# Patient Record
Sex: Male | Born: 1960 | Race: White | Hispanic: No | Marital: Married | Smoking: Never smoker
Health system: Southern US, Community
[De-identification: ages and names within clinical notes are randomized; demographics above are authoritative.]

## PROBLEM LIST (undated history)

## (undated) DIAGNOSIS — Z87442 Personal history of urinary calculi: Secondary | ICD-10-CM

## (undated) DIAGNOSIS — Z789 Other specified health status: Secondary | ICD-10-CM

## (undated) DIAGNOSIS — M109 Gout, unspecified: Secondary | ICD-10-CM

## (undated) DIAGNOSIS — L570 Actinic keratosis: Secondary | ICD-10-CM

## (undated) DIAGNOSIS — K219 Gastro-esophageal reflux disease without esophagitis: Secondary | ICD-10-CM

## (undated) HISTORY — DX: Actinic keratosis: L57.0

## (undated) HISTORY — PX: TONSILLECTOMY: SUR1361

## (undated) HISTORY — PX: COLONOSCOPY: SHX174

---

## 2004-12-07 ENCOUNTER — Ambulatory Visit: Payer: Self-pay | Admitting: Internal Medicine

## 2007-07-19 ENCOUNTER — Ambulatory Visit: Payer: Self-pay | Admitting: Family Medicine

## 2012-05-06 ENCOUNTER — Ambulatory Visit: Payer: Self-pay | Admitting: Gastroenterology

## 2012-05-07 LAB — PATHOLOGY REPORT

## 2016-09-20 ENCOUNTER — Encounter
Admission: RE | Admit: 2016-09-20 | Discharge: 2016-09-20 | Disposition: A | Discharge status: Home / Self Care | Payer: BC Managed Care – PPO | Source: Ambulatory Visit | Attending: Unknown Physician Specialty | Admitting: Unknown Physician Specialty

## 2016-09-20 HISTORY — DX: Gastro-esophageal reflux disease without esophagitis: K21.9

## 2016-09-20 HISTORY — DX: Gout, unspecified: M10.9

## 2016-09-20 HISTORY — DX: Personal history of urinary calculi: Z87.442

## 2016-09-20 NOTE — Patient Instructions (Signed)
  Your procedure is scheduled on: 09-28-16 Report to Same Day Surgery 2nd floor medical mall [facility] Entrance-take elevator on left to 2nd floor.  Check in with surgery information desk.) To find out your arrival time please call [phone] between 1PM - 3PM on 09-27-16  Remember: Instructions that are not followed completely may result in serious medical risk, up to and including death, or upon the discretion of your surgeon and anesthesiologist your surgery may need to be rescheduled.    _x___ 1. Do not eat food or drink liquids after midnight. No gum chewing or hard candies.     __x__ 2. No Alcohol for 24 hours before or after surgery.   __x__3. No Smoking for 24 prior to surgery.   ____  4. Bring all medications with you on the day of surgery if instructed.    __x__ 5. Notify your doctor if there is any change in your medical condition     (cold, fever, infections).     Do not wear jewelry, make-up, hairpins, clips or nail polish.  Do not wear lotions, powders, or perfumes. You may wear deodorant.  Do not shave 48 hours prior to surgery. Men may shave face and neck.  Do not bring valuables to the hospital.    [facility] is not responsible for any belongings or valuables.               Contacts, dentures or bridgework may not be worn into surgery.  Leave your suitcase in the car. After surgery it may be brought to your room.  For patients admitted to the hospital, discharge time is determined by your treatment team.   Patients discharged the day of surgery will not be allowed to drive home.  You will need someone to drive you home and stay with you the night of your procedure.    Please read over the following fact sheets that you were given:   [facility] Preparing for Surgery and or MRSA Information   _X___ Take these medicines the morning of surgery with A SIP OF WATER:    1. [place]  2. TAKE A NEXIUM Wednesday NIGHT BEFORE BED  3.  4.  5.  6.  ____Fleets  enema or Magnesium Citrate as directed.   ____ Use CHG Soap or sage wipes as directed on instruction sheet   ____ Use inhalers on the day of surgery and bring to hospital day of surgery  ____ Stop metformin 2 days prior to surgery    ____ Take 1/2 of usual insulin dose the night before surgery and none on the morning of surgery.   ____ Stop Aspirin, Coumadin, Pllavix ,Eliquis, Effient, or Pradaxa  x__ Stop Anti-inflammatories such as Advil, Aleve, Ibuprofen, Motrin, Naproxen,          Naprosyn, Goodies powders or aspirin products NOW-Ok to take Tylenol.   ____ Stop supplements until after surgery.    ____ Bring C-Pap to the hospital.

## 2016-09-28 ENCOUNTER — Encounter
Admission: RE | Disposition: A | Discharge status: Home / Self Care | Payer: Self-pay | Source: Ambulatory Visit | Attending: Unknown Physician Specialty

## 2016-09-28 ENCOUNTER — Ambulatory Visit
Admission: RE | Admit: 2016-09-28 | Discharge: 2016-09-28 | Disposition: A | Discharge status: Home / Self Care | Payer: BC Managed Care – PPO | Source: Ambulatory Visit | Attending: Unknown Physician Specialty | Admitting: Unknown Physician Specialty

## 2016-09-28 ENCOUNTER — Ambulatory Visit: Payer: BC Managed Care – PPO | Admitting: Anesthesiology

## 2016-09-28 ENCOUNTER — Encounter: Payer: Self-pay | Admitting: *Deleted

## 2016-09-28 DIAGNOSIS — M67431 Ganglion, right wrist: Secondary | ICD-10-CM | POA: Diagnosis present

## 2016-09-28 DIAGNOSIS — M898X8 Other specified disorders of bone, other site: Secondary | ICD-10-CM | POA: Diagnosis not present

## 2016-09-28 HISTORY — PX: GANGLION CYST EXCISION: SHX1691

## 2016-09-28 SURGERY — EXCISION, GANGLION CYST, WRIST
Anesthesia: General | Laterality: Right | Wound class: Clean

## 2016-09-28 MED ORDER — FENTANYL CITRATE (PF) 100 MCG/2ML IJ SOLN
INTRAMUSCULAR | Status: AC
  Administered 2016-09-28: 50 ug via INTRAVENOUS
  Filled 2016-09-28: qty 2

## 2016-09-28 MED ORDER — HYDROCODONE-ACETAMINOPHEN 5-325 MG PO TABS: 1.0000 | ORAL_TABLET | Freq: Four times a day (QID) | ORAL | 0 refills | Status: DC | PRN

## 2016-09-28 MED ORDER — FENTANYL CITRATE (PF) 100 MCG/2ML IJ SOLN
25.0000 ug | INTRAMUSCULAR | Status: DC | PRN
  Administered 2016-09-28 (×3): 50 ug via INTRAVENOUS

## 2016-09-28 MED ORDER — PROPOFOL 10 MG/ML IV BOLUS
INTRAVENOUS | Status: DC | PRN
  Administered 2016-09-28: 150 mg via INTRAVENOUS
  Administered 2016-09-28: 50 mg via INTRAVENOUS

## 2016-09-28 MED ORDER — HYDROCODONE-ACETAMINOPHEN 5-325 MG PO TABS
1.0000 | ORAL_TABLET | Freq: Four times a day (QID) | ORAL | Status: DC | PRN
  Administered 2016-09-28: 1 via ORAL

## 2016-09-28 MED ORDER — BUPIVACAINE HCL (PF) 0.5 % IJ SOLN
INTRAMUSCULAR | Status: DC | PRN
  Administered 2016-09-28: 4 mL

## 2016-09-28 MED ORDER — LIDOCAINE HCL (CARDIAC) 20 MG/ML IV SOLN
INTRAVENOUS | Status: DC | PRN
  Administered 2016-09-28: 40 mg via INTRAVENOUS

## 2016-09-28 MED ORDER — PROMETHAZINE HCL 25 MG/ML IJ SOLN: 6.2500 mg | INTRAMUSCULAR | Status: DC | PRN

## 2016-09-28 MED ORDER — BUPIVACAINE HCL (PF) 0.5 % IJ SOLN
INTRAMUSCULAR | Status: AC
  Filled 2016-09-28: qty 30

## 2016-09-28 MED ORDER — FENTANYL CITRATE (PF) 100 MCG/2ML IJ SOLN
INTRAMUSCULAR | Status: DC | PRN
  Administered 2016-09-28: 25 ug via INTRAVENOUS
  Administered 2016-09-28: 50 ug via INTRAVENOUS
  Administered 2016-09-28: 25 ug via INTRAVENOUS

## 2016-09-28 MED ORDER — GLYCOPYRROLATE 0.2 MG/ML IJ SOLN
INTRAMUSCULAR | Status: DC | PRN
  Administered 2016-09-28: 0.2 mg via INTRAVENOUS

## 2016-09-28 MED ORDER — ONDANSETRON HCL 4 MG/2ML IJ SOLN
INTRAMUSCULAR | Status: DC | PRN
  Administered 2016-09-28: 4 mg via INTRAVENOUS

## 2016-09-28 MED ORDER — HYDROCODONE-ACETAMINOPHEN 5-325 MG PO TABS
ORAL_TABLET | ORAL | Status: AC
  Administered 2016-09-28: 1 via ORAL
  Filled 2016-09-28: qty 1

## 2016-09-28 MED ORDER — MIDAZOLAM HCL 2 MG/2ML IJ SOLN
INTRAMUSCULAR | Status: DC | PRN
  Administered 2016-09-28: 2 mg via INTRAVENOUS

## 2016-09-28 MED ORDER — LACTATED RINGERS IV SOLN
INTRAVENOUS | Status: DC
Start: 2016-09-28 — End: 2016-09-28
  Administered 2016-09-28 (×2): via INTRAVENOUS

## 2016-09-28 SURGICAL SUPPLY — 29 items
BANDAGE ELASTIC 3 LF NS (GAUZE/BANDAGES/DRESSINGS) IMPLANT
BNDG ESMARK 4X12 TAN STRL LF (GAUZE/BANDAGES/DRESSINGS) ×2 IMPLANT
CANISTER SUCT 1200ML W/VALVE (MISCELLANEOUS) ×2 IMPLANT
CAST PADDING 3X4FT ST 30246 (SOFTGOODS) ×1
CUFF TOURN 18 STER (MISCELLANEOUS) ×2 IMPLANT
DURAPREP 26ML APPLICATOR (WOUND CARE) ×2 IMPLANT
ELECT REM PT RETURN 9FT ADLT (ELECTROSURGICAL) ×2
ELECTRODE REM PT RTRN 9FT ADLT (ELECTROSURGICAL) ×1 IMPLANT
GAUZE SPONGE 4X4 12PLY STRL (GAUZE/BANDAGES/DRESSINGS) ×2 IMPLANT
GAUZE XEROFORM 4X4 STRL (GAUZE/BANDAGES/DRESSINGS) ×2 IMPLANT
GLOVE BIO SURGEON STRL SZ8 (GLOVE) ×4 IMPLANT
GLOVE BIOGEL M STRL SZ7.5 (GLOVE) ×4 IMPLANT
GLOVE INDICATOR 8.0 STRL GRN (GLOVE) ×2 IMPLANT
GOWN STRL REUS W/ TWL LRG LVL3 (GOWN DISPOSABLE) ×1 IMPLANT
GOWN STRL REUS W/TWL LRG LVL3 (GOWN DISPOSABLE) ×1
GOWN STRL REUS W/TWL LRG LVL4 (GOWN DISPOSABLE) ×2 IMPLANT
KIT RM TURNOVER STRD PROC AR (KITS) ×2 IMPLANT
NS IRRIG 500ML POUR BTL (IV SOLUTION) ×2 IMPLANT
PACK EXTREMITY ARMC (MISCELLANEOUS) ×2 IMPLANT
PAD CAST CTTN 3X4 STRL (SOFTGOODS) ×1 IMPLANT
SPLINT CAST 1 STEP 3X12 (MISCELLANEOUS) IMPLANT
STOCKINETTE STRL 4IN 9604848 (GAUZE/BANDAGES/DRESSINGS) ×2 IMPLANT
SUT ETHILON 3-0 FS-10 30 BLK (SUTURE) ×2
SUT ETHILON 4-0 (SUTURE) ×1
SUT ETHILON 4-0 FS2 18XMFL BLK (SUTURE) ×1
SUT VIC AB 3-0 SH 27 (SUTURE) ×1
SUT VIC AB 3-0 SH 27X BRD (SUTURE) ×1 IMPLANT
SUTURE EHLN 3-0 FS-10 30 BLK (SUTURE) ×1 IMPLANT
SUTURE ETHLN 4-0 FS2 18XMF BLK (SUTURE) ×1 IMPLANT

## 2016-09-28 NOTE — OR Nursing (Signed)
Patient able to move fingers cap refill brisk. Wife wanted to know if patient could take motrin asked Dr Jefm Bryant and he said yes.

## 2016-09-28 NOTE — H&P (Signed)
  H and P reviewed. No changes. Uploaded at later date. 

## 2016-09-28 NOTE — Anesthesia Preprocedure Evaluation (Signed)
Anesthesia Evaluation  Patient identified by MRN, date of birth, ID band Patient awake    Reviewed: Allergy & Precautions, H&P , NPO status , Patient's Chart, lab work & pertinent test results, reviewed documented beta blocker date and time   History of Anesthesia Complications Negative for: history of anesthetic complications  Airway Mallampati: III  TM Distance: >3 FB Neck ROM: full    Dental no notable dental hx. (+) Caps   Pulmonary neg pulmonary ROS,    Pulmonary exam normal breath sounds clear to auscultation       Cardiovascular Exercise Tolerance: Good negative cardio ROS Normal cardiovascular exam Rhythm:regular Rate:Normal     Neuro/Psych negative neurological ROS  negative psych ROS   GI/Hepatic Neg liver ROS, GERD  ,  Endo/Other  negative endocrine ROS  Renal/GU Renal disease (kidney stones)  negative genitourinary   Musculoskeletal   Abdominal   Peds  Hematology negative hematology ROS (+)   Anesthesia Other Findings Past Medical History: No date: GERD (gastroesophageal reflux disease)     Comment: OCC-NEXIUM PRN No date: Gout No date: History of kidney stones   Reproductive/Obstetrics negative OB ROS                             Anesthesia Physical Anesthesia Plan  ASA: II  Anesthesia Plan: General   Post-op Pain Management:    Induction:   Airway Management Planned:   Additional Equipment:   Intra-op Plan:   Post-operative Plan:   Informed Consent: I have reviewed the patients History and Physical, chart, labs and discussed the procedure including the risks, benefits and alternatives for the proposed anesthesia with the patient or authorized representative who has indicated his/her understanding and acceptance.   Dental Advisory Given  Plan Discussed with: Anesthesiologist, CRNA and Surgeon  Anesthesia Plan Comments:         Anesthesia Quick  Evaluation

## 2016-09-28 NOTE — Transfer of Care (Signed)
Immediate Anesthesia Transfer of Care Note  Patient: Joseph [name]  Procedure(s) Performed: Procedure(s): REMOVAL GANGLION OF WRIST (Right)  Patient Location: PACU  Anesthesia Type:General  Level of Consciousness: sedated  Airway & Oxygen Therapy: Patient Spontanous Breathing and Patient connected to face mask oxygen  Post-op Assessment: Report given to RN and Post -op Vital signs reviewed and stable  Post vital signs: Reviewed and stable  Last Vitals:  Vitals:   09/28/16 0614 09/28/16 0847  BP: (!) 149/86 113/82  Pulse: (!) 58 (!) 58  Resp: 16 12  Temp: 36.7 C 36.2 C    Last Pain:  Vitals:   09/28/16 0614  TempSrc: Oral  PainSc: 3          Complications: No apparent anesthesia complications

## 2016-09-28 NOTE — Discharge Instructions (Signed)
AMBULATORY SURGERY  DISCHARGE INSTRUCTIONS   1) The drugs that you were given will stay in your system until tomorrow so for the next 24 hours you should not:  A) Drive an automobile B) Make any legal decisions C) Drink any alcoholic beverage   2) You may resume regular meals tomorrow.  Today it is better to start with liquids and gradually work up to solid foods.  You may eat anything you prefer, but it is better to start with liquids, then soup and crackers, and gradually work up to solid foods.   3) Please notify your doctor immediately if you have any unusual bleeding, trouble breathing, redness and pain at the surgery site, drainage, fever, or pain not relieved by medication.    4) Additional Instructions:        Please contact your physician with any problems or Same Day Surgery at [phone], Monday through Friday 6 am to 4 pm, or [place] at [facility] number at [phone].   Ice pack Elevation RTC in about 10 days

## 2016-09-28 NOTE — Anesthesia Procedure Notes (Signed)
Date/Time: 09/28/2016 7:45 AM Performed by: Allean Found Pre-anesthesia Checklist: Patient identified, Emergency Drugs available, Suction available, Patient being monitored and Timeout performed Patient Re-evaluated:Patient Re-evaluated prior to inductionOxygen Delivery Method: Circle system utilized Preoxygenation: Pre-oxygenation with 100% oxygen Intubation Type: IV induction Ventilation: Mask ventilation without difficulty LMA: LMA inserted LMA Size: 4.0 Placement Confirmation: positive ETCO2 Tube secured with: Tape

## 2016-09-28 NOTE — Op Note (Signed)
      PATIENT:[name], Joseph H.  PRE-OPERATIVE DIAGNOSIS: Carpal boss right wrist  POST-OPERATIVE DIAGNOSIS: Same  PROCEDURE: Excision carpal boss dorsum right second metacarpal  SURGEON: Mariel Kansky., MD  ASSISTANTS: None  ANESTHESIA: Gen.  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 Because of the discomfort the patient was desirous of having it excised. Consequently the patient was scheduled for excision of carpal Bosse right wrist.  OP NOTE: The patient was taken the operating room where satisfactory  general anesthesia was achieved. A tourniquet was applied to the right upper extremity. The right upper extremity was prepped and draped in usual fashion for a procedure about the wrist. The right upper extremity was exsanguinated and the tourniquet was inflated. An inch and a half transverse incision was made over the mid aspect of the dorsal boss. I bluntly dissected down to the second metacarpal boss. The extensor tendons were retracted. The base of the bony boss was exposed. I then osteotomized the boss..  I used a rasp to smooth over the osteotomized surface on the dorsum of the base of the right second metacarpal. The periosteum was closed with 3-0 Vicryl The tourniquet was released. It was up for about 17 minutes. Bleeding was controlled with coagulation cautery. The skin was closed with 4-0 nylon sutures in vertical mattress fashion. A fiberglass volar splin was then applied with the wrist in neutral position. The patient was then awakened and transferred to his stretcher bed. He was taken to the recovery room in satisfactory condition. Blood loss was negligible. Dr. Little Ishikawa June dictating  The patient then awakened and transferred to a stretcher bed. The patient was then  taken to the recovery room in satisfactory condition. Blood loss was negligible.

## 2016-09-29 NOTE — Anesthesia Postprocedure Evaluation (Signed)
Anesthesia Post Note  Patient: Joseph [name]  Procedure(s) Performed: Procedure(s) (LRB): REMOVAL GANGLION OF WRIST (Right)  Patient location during evaluation: PACU Anesthesia Type: General Level of consciousness: awake and alert Pain management: pain level controlled Vital Signs Assessment: post-procedure vital signs reviewed and stable Respiratory status: spontaneous breathing, nonlabored ventilation, respiratory function stable and patient connected to nasal cannula oxygen Cardiovascular status: blood pressure returned to baseline and stable Postop Assessment: no signs of nausea or vomiting Anesthetic complications: no    Last Vitals:  Vitals:   09/28/16 0953 09/28/16 1044  BP: 136/74 133/87  Pulse:  87  Resp: 14 16  Temp: 36.7 C (!) 35.7 C    Last Pain:  Vitals:   09/28/16 1044  TempSrc: Temporal  PainSc: 4                  Martha Clan

## 2016-11-07 ENCOUNTER — Emergency Department
Admission: EM | Admit: 2016-11-07 | Discharge: 2016-11-07 | Disposition: A | Discharge status: Home / Self Care | Payer: BC Managed Care – PPO | Attending: Emergency Medicine | Admitting: Emergency Medicine

## 2016-11-07 ENCOUNTER — Encounter: Payer: Self-pay | Admitting: Emergency Medicine

## 2016-11-07 DIAGNOSIS — R519 Headache, unspecified: Secondary | ICD-10-CM

## 2016-11-07 DIAGNOSIS — R51 Headache: Secondary | ICD-10-CM | POA: Diagnosis present

## 2016-11-07 DIAGNOSIS — Z79899 Other long term (current) drug therapy: Secondary | ICD-10-CM | POA: Diagnosis not present

## 2016-11-07 DIAGNOSIS — Z791 Long term (current) use of non-steroidal anti-inflammatories (NSAID): Secondary | ICD-10-CM | POA: Insufficient documentation

## 2016-11-07 MED ORDER — BUTALBITAL-APAP-CAFFEINE 50-325-40 MG PO TABS
1.0000 | ORAL_TABLET | Freq: Once | ORAL | Status: AC
  Administered 2016-11-07: 1 via ORAL
  Filled 2016-11-07: qty 1

## 2016-11-07 MED ORDER — BUTALBITAL-APAP-CAFFEINE 50-325-40 MG PO TABS: 1.0000 | ORAL_TABLET | Freq: Four times a day (QID) | ORAL | 0 refills | Status: AC | PRN

## 2016-11-07 NOTE — ED Provider Notes (Signed)
[facility] Emergency Department Provider Note   ____________________________________________   I have reviewed the triage vital signs and the nursing notes.   HISTORY  Chief Complaint Headache   History limited by: Not Limited   HPI Joseph [name] is a 56 y.o. male who presents to the emergency department today because of concerns for headache. He states it is located behind his right eye and right temple region. It started 2 days ago. Initially started as a mild headache but gradually got worse. He states he got to its most severe last night before he went to bed. When he woke up this morning with slightly better but still present. Patient has tried some over-the-counter headache medication without any great relief. Patient states that he has a history of getting headaches but usually the headache medications work. He denies any nausea or vomiting. Denies any blurred vision although he has a little bit of double vision. The patient denies any trauma to his head. Denies any fevers.   Past Medical History:  Diagnosis Date  . GERD (gastroesophageal reflux disease)    OCC-NEXIUM PRN  . Gout   . History of kidney stones     There are no active problems to display for this patient.   Past Surgical History:  Procedure Laterality Date  . COLONOSCOPY    . GANGLION CYST EXCISION Right 09/28/2016   Procedure: REMOVAL GANGLION OF WRIST;  Surgeon: Leanor Kail, MD;  Location: [facility] ORS;  Service: Orthopedics;  Laterality: Right;  . TONSILLECTOMY      Prior to Admission medications   Medication Sig Start Date End Date Taking? Authorizing Provider  acetaminophen (TYLENOL) 500 MG tablet Take 1,000 mg by mouth every 8 (eight) hours as needed (for pain).    Historical Provider, MD  esomeprazole (NEXIUM) 20 MG capsule Take 20 mg by mouth as needed.    Historical Provider, MD  HYDROcodone-acetaminophen (NORCO) 5-325 MG tablet Take 1-2 tablets by mouth every 6 (six)  hours as needed for moderate pain. MAXIMUM TOTAL ACETAMINOPHEN DOSE IS 4000 MG PER DAY 09/28/16   Leanor Kail, MD  ibuprofen (ADVIL,MOTRIN) 200 MG tablet Take 200-800 mg by mouth every 8 (eight) hours as needed (for pain).    Historical Provider, MD    Allergies Patient has no known allergies.  History reviewed. No pertinent family history.  Social History Social History  Substance Use Topics  . Smoking status: Never Smoker  . Smokeless tobacco: Never Used  . Alcohol use Yes     Comment: OCC MIXED DRINKS 1-2 /WEEKLY    Review of Systems  Constitutional: Negative for fever. Cardiovascular: Negative for chest pain. Respiratory: Negative for shortness of breath. Gastrointestinal: Negative for abdominal pain, vomiting and diarrhea. Neurological: Positive for headache.  10-point ROS otherwise negative.  ____________________________________________   PHYSICAL EXAM:  VITAL SIGNS: ED Triage Vitals [11/07/16 0859]  Enc Vitals Group     BP (!) 143/96     Pulse Rate 71     Resp 18     Temp 97.6 F (36.4 C)     Temp Source Oral     SpO2 96 %     Weight 190 lb (86.2 kg)     Height 5\' 10"  (1.778 m)     Head Circumference      Peak Flow      Pain Score 9   Constitutional: Alert and oriented. Well appearing and in no distress. Eyes: Conjunctivae are normal. Normal extraocular movements. ENT  Head: Normocephalic and atraumatic. TMs wnl bilaterally.   Nose: No congestion/rhinnorhea.   Mouth/Throat: Mucous membranes are moist.   Neck: No stridor. Hematological/Lymphatic/Immunilogical: No cervical lymphadenopathy. Cardiovascular: Normal rate, regular rhythm.  No murmurs, rubs, or gallops.  Respiratory: Normal respiratory effort without tachypnea nor retractions. Breath sounds are clear and equal bilaterally. No wheezes/rales/rhonchi. Gastrointestinal: Soft and non tender. No rebound. No guarding.  Genitourinary: Deferred Musculoskeletal: Normal range of  motion in all extremities. No lower extremity edema. Neurologic:  Normal speech and language. Face symmetric. Tongue midline.  Strenght 5/5 in upper and lower extremities. Sensation grossly intact. No gross focal neurologic deficits are appreciated.  Skin:  Skin is warm, dry and intact. No rash noted. Psychiatric: Mood and affect are normal. Speech and behavior are normal. Patient exhibits appropriate insight and judgment.  ____________________________________________    LABS (pertinent positives/negatives)  None  ____________________________________________   EKG  None  ____________________________________________    RADIOLOGY  None  ____________________________________________   PROCEDURES  Procedures  ____________________________________________   INITIAL IMPRESSION / ASSESSMENT AND PLAN / ED COURSE  Pertinent labs & imaging results that were available during my care of the patient were reviewed by me and considered in my medical decision making (see chart for details).  Patient presented to the emergency department today with headache. On exam no focal neuro deficits. Discussed CT with patient, he was comfortable to defer at this time which I think is reasonable. The patient was given Fioricet and did feel better afterwards. Will discharge with further prescription.  ____________________________________________   FINAL CLINICAL IMPRESSION(S) / ED DIAGNOSES  Final diagnoses:  Nonintractable headache, unspecified chronicity pattern, unspecified headache type     Note: This dictation was prepared with Dragon dictation. Any transcriptional errors that result from this process are unintentional     Nance Pear, MD 11/07/16 1130

## 2016-11-07 NOTE — Discharge Instructions (Signed)
Please seek medical attention for any high fevers, chest pain, shortness of breath, change in behavior, persistent vomiting, bloody stool or any other new or concerning symptoms.  

## 2016-11-07 NOTE — ED Triage Notes (Signed)
Pt presents to ED with c/o HA at this time. Pt states HA started on Sunday and since has worsened. Pt states went to [facility] acute Care and was sent here for CT scan. Pt also c/o runny nose at this time. Pt states, "I think this is a sinus infection". Pt is alert and oriented, facial symmetry intact, pt is ambulatory with no difficulty, denies any numbness/tingling, denies any photosensitivity at this time.

## 2020-05-19 ENCOUNTER — Encounter: Payer: BC Managed Care – PPO | Admitting: Dermatology

## 2020-07-01 ENCOUNTER — Other Ambulatory Visit: Payer: Self-pay

## 2020-07-01 ENCOUNTER — Ambulatory Visit: Payer: BC Managed Care – PPO | Admitting: Dermatology

## 2020-07-01 DIAGNOSIS — L814 Other melanin hyperpigmentation: Secondary | ICD-10-CM

## 2020-07-01 DIAGNOSIS — D229 Melanocytic nevi, unspecified: Secondary | ICD-10-CM

## 2020-07-01 DIAGNOSIS — Z1283 Encounter for screening for malignant neoplasm of skin: Secondary | ICD-10-CM

## 2020-07-01 DIAGNOSIS — L570 Actinic keratosis: Secondary | ICD-10-CM | POA: Diagnosis not present

## 2020-07-01 DIAGNOSIS — D1800 Hemangioma unspecified site: Secondary | ICD-10-CM

## 2020-07-01 DIAGNOSIS — L821 Other seborrheic keratosis: Secondary | ICD-10-CM

## 2020-07-01 DIAGNOSIS — L578 Other skin changes due to chronic exposure to nonionizing radiation: Secondary | ICD-10-CM

## 2020-07-01 NOTE — Progress Notes (Signed)
   Follow-Up Visit   Subjective  Joseph [name] is a 59 y.o. male who presents for the following: Annual Exam (1 year f/u hx of Aks, previous treatment LN2, PDT ). Pt report no known history of skin cancer. The patient presents for Total-Body Skin Exam (TBSE) for skin cancer screening and mole check.  The following portions of the chart were reviewed this encounter and updated as appropriate:  Tobacco  Allergies  Meds  Problems  Med Hx  Surg Hx  Fam Hx     Review of Systems:  No other skin or systemic complaints except as noted in HPI or Assessment and Plan.  Objective  Well appearing patient in no apparent distress; mood and affect are within normal limits.  A full examination was performed including scalp, head, eyes, ears, nose, lips, neck, chest, axillae, abdomen, back, buttocks, bilateral upper extremities, bilateral lower extremities, hands, feet, fingers, toes, fingernails, and toenails. All findings within normal limits unless otherwise noted below.  Objective  scalp, face (17): Erythematous thin papules/macules with gritty scale.    Assessment & Plan  AK (actinic keratosis) (17) scalp, face  May consider topical treatment or PDT in the future on the scalp   Destruction of lesion - scalp, face Complexity: simple   Destruction method: cryotherapy   Informed consent: discussed and consent obtained   Timeout:  patient name, date of birth, surgical site, and procedure verified Lesion destroyed using liquid nitrogen: Yes   Region frozen until ice ball extended beyond lesion: Yes   Outcome: patient tolerated procedure well with no complications   Post-procedure details: wound care instructions given    Lentigines - Scattered tan macules - Discussed due to sun exposure - Benign, observe - Call for any changes  Seborrheic Keratoses - Stuck-on, waxy, tan-brown papules and plaques  - Discussed benign etiology and prognosis. - Observe - Call for any  changes  Melanocytic Nevi - Tan-brown and/or pink-flesh-colored symmetric macules and papules - Benign appearing on exam today - Observation - Call clinic for new or changing moles - Recommend daily use of broad spectrum spf 30+ sunscreen to sun-exposed areas.   Hemangiomas - Red papules - Discussed benign nature - Observe - Call for any changes  Actinic Damage - diffuse scaly erythematous macules with underlying dyspigmentation - Recommend daily broad spectrum sunscreen SPF 30+ to sun-exposed areas, reapply every 2 hours as needed.  - Call for new or changing lesions.  Skin cancer screening performed today.  Return in about 6 months (around 12/29/2020) for Aks .  IMarye Round, CMA, am acting as scribe for Sarina Ser, MD .  Documentation: I have reviewed the above documentation for accuracy and completeness, and I agree with the above.  Sarina Ser, MD

## 2020-07-01 NOTE — Patient Instructions (Signed)
Recommend daily broad spectrum sunscreen SPF 30+ to sun-exposed areas, reapply every 2 hours as needed. Call for new or changing lesions.  Cryotherapy Aftercare  . Wash gently with soap and water everyday.   . Apply Vaseline and Band-Aid daily until healed.  

## 2020-07-05 ENCOUNTER — Encounter: Payer: Self-pay | Admitting: Dermatology

## 2020-07-12 ENCOUNTER — Ambulatory Visit: Payer: BC Managed Care – PPO | Admitting: Dermatology

## 2020-07-12 ENCOUNTER — Other Ambulatory Visit: Payer: Self-pay

## 2020-07-12 ENCOUNTER — Encounter: Payer: Self-pay | Admitting: Dermatology

## 2020-07-12 DIAGNOSIS — D294 Benign neoplasm of scrotum: Secondary | ICD-10-CM

## 2020-07-12 DIAGNOSIS — D369 Benign neoplasm, unspecified site: Secondary | ICD-10-CM

## 2020-07-12 NOTE — Progress Notes (Signed)
   Follow-Up Visit   Subjective  Joseph [name] is a 59 y.o. male who presents for the following: Area of concern: (red spots on bottom of testicles, patient squeezed and popped one and it bleed a long time. ).  The following portions of the chart were reviewed this encounter and updated as appropriate:  Tobacco  Allergies  Meds  Problems  Med Hx  Surg Hx  Fam Hx     Review of Systems:  No other skin or systemic complaints except as noted in HPI or Assessment and Plan.  Objective  Well appearing patient in no apparent distress; mood and affect are within normal limits.  A focused examination was performed including testicles. Relevant physical exam findings are noted in the Assessment and Plan.  Objective  Anterior Scrotum: Red papules   Assessment & Plan  Angiofibroma Scrotum Multiple; one with crust  Benign-appearing.  Observation.  Call clinic for new or changing lesions.  Recommend daily use of broad spectrum spf 30+ sunscreen to sun-exposed areas.   Recommend to leave alone and watch, laser treatment, or cauterize. Patient defers treatment at this time.  Return if symptoms worsen or fail to improve.  Marene Lenz, CMA, am acting as scribe for Sarina Ser, MD . Documentation: I have reviewed the above documentation for accuracy and completeness, and I agree with the above.  Sarina Ser, MD

## 2020-07-12 NOTE — Patient Instructions (Signed)
Recommend daily broad spectrum sunscreen SPF 30+ to sun-exposed areas, reapply every 2 hours as needed. Call for new or changing lesions.  

## 2021-01-05 ENCOUNTER — Encounter: Payer: BC Managed Care – PPO | Admitting: Dermatology

## 2021-01-19 ENCOUNTER — Encounter: Payer: Self-pay | Admitting: Dermatology

## 2021-01-19 ENCOUNTER — Ambulatory Visit: Payer: BC Managed Care – PPO | Admitting: Dermatology

## 2021-01-19 ENCOUNTER — Other Ambulatory Visit: Payer: Self-pay

## 2021-01-19 DIAGNOSIS — L578 Other skin changes due to chronic exposure to nonionizing radiation: Secondary | ICD-10-CM

## 2021-01-19 DIAGNOSIS — L821 Other seborrheic keratosis: Secondary | ICD-10-CM

## 2021-01-19 DIAGNOSIS — L570 Actinic keratosis: Secondary | ICD-10-CM

## 2021-01-19 NOTE — Patient Instructions (Addendum)
  5-Fluorouracil/Calcipotriene Patient Education  Treat scalp twice a day for 7 days  Actinic keratoses are the dry, red scaly spots on the skin caused by sun damage. A portion of these spots can turn into skin cancer with time, and treating them can help prevent development of skin cancer.   Treatment of these spots requires removal of the defective skin cells. There are various ways to remove actinic keratoses, including freezing with liquid nitrogen, treatment with creams, or treatment with a blue light procedure in the office.   5-fluorouracil cream is a topical cream used to treat actinic keratoses. It works by interfering with the growth of abnormal fast-growing skin cells, such as actinic keratoses. These cells peel off and are replaced by healthy ones.   5-fluorouracil/calcipotriene is a combination of the 5-fluorouracil cream with a vitamin D analog cream called calcipotriene. The calcipotriene alone does not treat actinic keratoses. However, when it is combined with 5-fluorouracil, it helps the 5-fluorouracil treat the actinic keratoses much faster so that the same results can be achieved with a much shorter treatment time.  INSTRUCTIONS FOR 5-FLUOROURACIL/CALCIPOTRIENE CREAM:   5-fluorouracil/calcipotriene cream typically only needs to be used for 4-7 days. A thin layer should be applied twice a day to the treatment areas recommended by your physician.   If your physician prescribed you separate tubes of 5-fluourouracil and calcipotriene, apply a thin layer of 5-fluorouracil followed by a thin layer of calcipotriene.   Avoid contact with your eyes, nostrils, and mouth. Do not use 5-fluorouracil/calcipotriene cream on infected or open wounds.   You will develop redness, irritation and some crusting at areas where you have pre-cancer damage/actinic keratoses. IF YOU DEVELOP PAIN, BLEEDING, OR SIGNIFICANT CRUSTING, STOP THE TREATMENT EARLY - you have already gotten a good response and the  actinic keratoses should clear up well.  Wash your hands after applying 5-fluorouracil 5% cream on your skin.   A moisturizer or sunscreen with a minimum SPF 30 should be applied each morning.   Once you have finished the treatment, you can apply a thin layer of Vaseline twice a day to irritated areas to soothe and calm the areas more quickly. If you experience significant discomfort, contact your physician.  For some patients it is necessary to repeat the treatment for best results.  SIDE EFFECTS: When using 5-fluorouracil/calcipotriene cream, you may have mild irritation, such as redness, dryness, swelling, or a mild burning sensation. This usually resolves within 2 weeks. The more actinic keratoses you have, the more redness and inflammation you can expect during treatment. Eye irritation has been reported rarely. If this occurs, please let us know.  If you have any trouble using this cream, please call the office. If you have any other questions about this information, please do not hesitate to ask me before you leave the office.   Instructions for [facility]  One or more of your medications was sent to the [facility] mail order compounding pharmacy. You will receive an email from them and can purchase the medicine through that link. It will then be mailed to your home at the address you confirmed. If for any reason you do not receive an email from them, please check your spam folder. If you still do not find the email, please let us know. [facility] phone number is [phone].

## 2021-01-19 NOTE — Progress Notes (Signed)
Follow-Up Visit   Subjective  Joseph [name] is a 60 y.o. male who presents for the following: Follow-up (6 months f/u hx of Aks on the face and scalp ). Past treatment for AKs was LN2 and PDT several years ago.   The following portions of the chart were reviewed this encounter and updated as appropriate:   Tobacco  Allergies  Meds  Problems  Med Hx  Surg Hx  Fam Hx     Review of Systems:  No other skin or systemic complaints except as noted in HPI or Assessment and Plan.  Objective  Well appearing patient in no apparent distress; mood and affect are within normal limits.  A focused examination was performed including face,scalp . Relevant physical exam findings are noted in the Assessment and Plan.  Objective  Scalp x 5, face (5): Erythematous thin papules/macules with gritty scale.    Assessment & Plan  AK (actinic keratosis) (5) Scalp x 5, face  - Start 5-fluorouracil/calcipotriene cream twice a day for 7 days to affected areas including scalp. Prescription sent to [facility]. Patient advised they will receive an email to purchase the medication online and have it sent to their home. Patient provided with handout reviewing treatment course and side effects and advised to call or message [place] on MyChart with any concerns.   Destruction of lesion - Scalp x 5, face Complexity: simple   Destruction method: cryotherapy   Informed consent: discussed and consent obtained   Timeout:  patient name, date of birth, surgical site, and procedure verified Lesion destroyed using liquid nitrogen: Yes   Region frozen until ice ball extended beyond lesion: Yes   Outcome: patient tolerated procedure well with no complications   Post-procedure details: wound care instructions given     Actinic Damage - chronic, secondary to cumulative UV radiation exposure/sun exposure over time - diffuse scaly erythematous macules with underlying dyspigmentation - Recommend  daily broad spectrum sunscreen SPF 30+ to sun-exposed areas, reapply every 2 hours as needed.  - Recommend staying in the shade or wearing long sleeves, sun glasses (UVA+UVB protection) and wide brim hats (4-inch brim around the entire circumference of the hat). - Call for new or changing lesions.  Seborrheic Keratoses - Stuck-on, waxy, tan-brown papules and/or plaques  - Benign-appearing - Discussed benign etiology and prognosis. - Observe - Call for any changes  Actinic Damage - Severe, confluent actinic changes with pre-cancerous actinic keratoses  - Severe, chronic, not at goal, secondary to cumulative UV radiation exposure over time - diffuse scaly erythematous macules and papules with underlying dyspigmentation - Discussed Prescription "Field Treatment" for Severe, Chronic Confluent Actinic Changes with Pre-Cancerous Actinic Keratoses For scalp  Field treatment involves treatment of an entire area of skin that has confluent Actinic Changes (Sun/ Ultraviolet light damage) and PreCancerous Actinic Keratoses by method of PhotoDynamic Therapy (PDT) and/or prescription Topical Chemotherapy agents such as 5-fluorouracil, 5-fluorouracil/calcipotriene, and/or imiquimod.  The purpose is to decrease the number of clinically evident and subclinical PreCancerous lesions to prevent progression to development of skin cancer by chemically destroying early precancer changes that may or may not be visible.  It has been shown to reduce the risk of developing skin cancer in the treated area. As a result of treatment, redness, scaling, crusting, and open sores may occur during treatment course. One or more than one of these methods may be used and may have to be used several times to control, suppress and eliminate the PreCancerous changes.  Discussed treatment course, expected reaction, and possible side effects. - Recommend daily broad spectrum sunscreen SPF 30+ to sun-exposed areas, reapply every 2 hours as  needed.  - Staying in the shade or wearing long sleeves, sun glasses (UVA+UVB protection) and wide brim hats (4-inch brim around the entire circumference of the hat) are also recommended. - Call for new or changing lesions.  Return in about 6 months (around 07/21/2021) for TBSE, AKs .  I, Marye Round, CMA, am acting as scribe for Sarina Ser, MD .  Documentation: I have reviewed the above documentation for accuracy and completeness, and I agree with the above.  Sarina Ser, MD

## 2021-06-28 ENCOUNTER — Other Ambulatory Visit: Payer: Self-pay | Admitting: Physical Medicine & Rehabilitation

## 2021-06-28 DIAGNOSIS — M5441 Lumbago with sciatica, right side: Secondary | ICD-10-CM

## 2021-07-07 ENCOUNTER — Ambulatory Visit: Payer: BC Managed Care – PPO

## 2021-07-11 ENCOUNTER — Other Ambulatory Visit: Payer: Self-pay

## 2021-07-11 ENCOUNTER — Ambulatory Visit
Admission: RE | Admit: 2021-07-11 | Discharge: 2021-07-11 | Disposition: A | Discharge status: Home / Self Care | Payer: BC Managed Care – PPO | Source: Ambulatory Visit | Attending: Physical Medicine & Rehabilitation | Admitting: Physical Medicine & Rehabilitation

## 2021-07-11 DIAGNOSIS — M5441 Lumbago with sciatica, right side: Secondary | ICD-10-CM

## 2021-07-28 ENCOUNTER — Other Ambulatory Visit: Payer: Self-pay

## 2021-07-28 ENCOUNTER — Ambulatory Visit: Payer: BC Managed Care – PPO | Admitting: Dermatology

## 2021-07-28 ENCOUNTER — Encounter: Payer: Self-pay | Admitting: Dermatology

## 2021-07-28 DIAGNOSIS — L578 Other skin changes due to chronic exposure to nonionizing radiation: Secondary | ICD-10-CM | POA: Diagnosis not present

## 2021-07-28 DIAGNOSIS — L570 Actinic keratosis: Secondary | ICD-10-CM

## 2021-07-28 DIAGNOSIS — L814 Other melanin hyperpigmentation: Secondary | ICD-10-CM

## 2021-07-28 DIAGNOSIS — D1800 Hemangioma unspecified site: Secondary | ICD-10-CM

## 2021-07-28 DIAGNOSIS — L821 Other seborrheic keratosis: Secondary | ICD-10-CM

## 2021-07-28 DIAGNOSIS — D229 Melanocytic nevi, unspecified: Secondary | ICD-10-CM

## 2021-07-28 DIAGNOSIS — Z1283 Encounter for screening for malignant neoplasm of skin: Secondary | ICD-10-CM | POA: Diagnosis not present

## 2021-07-28 NOTE — Progress Notes (Signed)
Follow-Up Visit   Subjective  Joseph [name] is a 60 y.o. male who presents for the following: Actinic Keratosis (6 month follow up of scalp and face - treated with LN2 nad 5FU/Calcipotriene cream. Also here for TBSE today). The patient presents for Total-Body Skin Exam (TBSE) for skin cancer screening and mole check.  The following portions of the chart were reviewed this encounter and updated as appropriate:   Tobacco  Allergies  Meds  Problems  Med Hx  Surg Hx  Fam Hx     Review of Systems:  No other skin or systemic complaints except as noted in HPI or Assessment and Plan.  Objective  Well appearing patient in no apparent distress; mood and affect are within normal limits.  A full examination was performed including scalp, head, eyes, ears, nose, lips, neck, chest, axillae, abdomen, back, buttocks, bilateral upper extremities, bilateral lower extremities, hands, feet, fingers, toes, fingernails, and toenails. All findings within normal limits unless otherwise noted below.  Scalp (2) Erythematous thin papules/macules with gritty scale.    Assessment & Plan   Lentigines - Scattered tan macules - Due to sun exposure - Benign-appearing, observe - Recommend daily broad spectrum sunscreen SPF 30+ to sun-exposed areas, reapply every 2 hours as needed. - Call for any changes  Seborrheic Keratoses - Stuck-on, waxy, tan-brown papules and/or plaques  - Benign-appearing - Discussed benign etiology and prognosis. - Observe - Call for any changes  Melanocytic Nevi - Tan-brown and/or pink-flesh-colored symmetric macules and papules - Benign appearing on exam today - Observation - Call clinic for new or changing moles - Recommend daily use of broad spectrum spf 30+ sunscreen to sun-exposed areas.   Hemangiomas - Red papules - Discussed benign nature - Observe - Call for any changes  Actinic Damage - Chronic condition, secondary to cumulative UV/sun exposure - diffuse  scaly erythematous macules with underlying dyspigmentation - Recommend daily broad spectrum sunscreen SPF 30+ to sun-exposed areas, reapply every 2 hours as needed.  - Staying in the shade or wearing long sleeves, sun glasses (UVA+UVB protection) and wide brim hats (4-inch brim around the entire circumference of the hat) are also recommended for sun protection.  - Call for new or changing lesions.  Skin cancer screening performed today.  AK (actinic keratosis) (2) Scalp  Actinic Damage - Severe, confluent actinic changes with pre-cancerous actinic keratoses  - Severe, chronic, not at goal, secondary to cumulative UV radiation exposure over time - diffuse scaly erythematous macules and papules with underlying dyspigmentation - Discussed Prescription "Field Treatment" for Severe, Chronic Confluent Actinic Changes with Pre-Cancerous Actinic Keratoses Field treatment involves treatment of an entire area of skin that has confluent Actinic Changes (Sun/ Ultraviolet light damage) and PreCancerous Actinic Keratoses by method of PhotoDynamic Therapy (PDT) and/or prescription Topical Chemotherapy agents such as 5-fluorouracil, 5-fluorouracil/calcipotriene, and/or imiquimod.  The purpose is to decrease the number of clinically evident and subclinical PreCancerous lesions to prevent progression to development of skin cancer by chemically destroying early precancer changes that may or may not be visible.  It has been shown to reduce the risk of developing skin cancer in the treated area. As a result of treatment, redness, scaling, crusting, and open sores may occur during treatment course. One or more than one of these methods may be used and may have to be used several times to control, suppress and eliminate the PreCancerous changes. Discussed treatment course, expected reaction, and possible side effects. - Recommend daily broad spectrum sunscreen SPF  30+ to sun-exposed areas, reapply every 2 hours as needed.   - Staying in the shade or wearing long sleeves, sun glasses (UVA+UVB protection) and wide brim hats (4-inch brim around the entire circumference of the hat) are also recommended. - Call for new or changing lesions.  Start 5% Flouoruracil/Calcipotriene cream bid x 7 days - patient has medication  Destruction of lesion - Scalp Complexity: simple   Destruction method: cryotherapy   Informed consent: discussed and consent obtained   Timeout:  patient name, date of birth, surgical site, and procedure verified Lesion destroyed using liquid nitrogen: Yes   Region frozen until ice ball extended beyond lesion: Yes   Outcome: patient tolerated procedure well with no complications   Post-procedure details: wound care instructions given    Skin cancer screening  Return in about 6 months (around 01/26/2022) for AK follow up.  I, Ashok Cordia, CMA, am acting as scribe for Sarina Ser, MD . Documentation: I have reviewed the above documentation for accuracy and completeness, and I agree with the above.  Sarina Ser, MD

## 2021-07-28 NOTE — Patient Instructions (Signed)

## 2021-12-29 ENCOUNTER — Encounter: Payer: Self-pay | Admitting: Dermatology

## 2022-01-26 ENCOUNTER — Ambulatory Visit: Payer: BC Managed Care – PPO | Admitting: Dermatology

## 2022-01-26 DIAGNOSIS — L814 Other melanin hyperpigmentation: Secondary | ICD-10-CM | POA: Diagnosis not present

## 2022-01-26 DIAGNOSIS — L821 Other seborrheic keratosis: Secondary | ICD-10-CM | POA: Diagnosis not present

## 2022-01-26 DIAGNOSIS — L578 Other skin changes due to chronic exposure to nonionizing radiation: Secondary | ICD-10-CM

## 2022-01-26 DIAGNOSIS — L570 Actinic keratosis: Secondary | ICD-10-CM

## 2022-01-26 NOTE — Progress Notes (Signed)
? ?Follow-Up Visit ?  ?Subjective  ?Joseph [name] is a 61 y.o. male who presents for the following: Actinic Keratosis (Hx aks at scalp. Used 5/fu cream to affected area for 7 days. ). ?The patient has spots, moles and lesions to be evaluated, some may be new or changing and the patient has concerns that these could be cancer. ?  ?The following portions of the chart were reviewed this encounter and updated as appropriate:  Tobacco  Allergies  Meds  Problems  Med Hx  Surg Hx  Fam Hx   ?  ?Review of Systems: No other skin or systemic complaints except as noted in HPI or Assessment and Plan. ? ?Objective  ?Well appearing patient in no apparent distress; mood and affect are within normal limits. ? ?A focused examination was performed including scalp and face. Relevant physical exam findings are noted in the Assessment and Plan. ? ?scalp x 9 (9) ?Erythematous thin papules/macules with gritty scale.  ? ? ?Assessment & Plan  ?Actinic keratosis (9) ?scalp x 9 ?start in 1 month : ?- Start 5-fluorouracil/calcipotriene cream twice a day for 7 days to affected areas including temples, forehead and scalp. Prescription sent to [facility]. Patient advised they will receive an email to purchase the medication online and have it sent to their home. Patient provided with handout reviewing treatment course and side effects and advised to call or message [place] on MyChart with any concerns. ? ?Actinic keratoses are precancerous spots that appear secondary to cumulative UV radiation exposure/sun exposure over time. They are chronic with expected duration over 1 year. A portion of actinic keratoses will progress to squamous cell carcinoma of the skin. It is not possible to reliably predict which spots will progress to skin cancer and so treatment is recommended to prevent development of skin cancer. ?Recommend daily broad spectrum sunscreen SPF 30+ to sun-exposed areas, reapply every 2 hours as needed.   ?Recommend staying in the shade or wearing long sleeves, sun glasses (UVA+UVB protection) and wide brim hats (4-inch brim around the entire circumference of the hat). ?Call for new or changing lesions.  ? ?Destruction of lesion - scalp x 9 ?Complexity: simple   ?Destruction method: cryotherapy   ?Informed consent: discussed and consent obtained   ?Timeout:  patient name, date of birth, surgical site, and procedure verified ?Lesion destroyed using liquid nitrogen: Yes   ?Region frozen until ice ball extended beyond lesion: Yes   ?Outcome: patient tolerated procedure well with no complications   ?Post-procedure details: wound care instructions given   ?Additional details:  Prior to procedure, discussed risks of blister formation, small wound, skin dyspigmentation, or rare scar following cryotherapy. Recommend Vaseline ointment to treated areas while healing. ? ?Seborrheic Keratoses ?- Stuck-on, waxy, tan-brown papules and/or plaques  ?- Benign-appearing ?- Discussed benign etiology and prognosis. ?- Observe ?- Call for any changes ? ?Lentigines ?- Scattered tan macules ?- Due to sun exposure ?- Benign-appering, observe ?- Recommend daily broad spectrum sunscreen SPF 30+ to sun-exposed areas, reapply every 2 hours as needed. ?- Call for any changes ? ?Actinic Damage - Severe, confluent actinic changes with pre-cancerous actinic keratoses  ?- Severe, chronic, not at goal, secondary to cumulative UV radiation exposure over time ?- diffuse scaly erythematous macules and papules with underlying dyspigmentation ?- Discussed Prescription "Field Treatment" for Severe, Chronic Confluent Actinic Changes with Pre-Cancerous Actinic Keratoses ?Field treatment involves treatment of an entire area of skin that has confluent Actinic Changes (Sun/ Ultraviolet light  damage) and PreCancerous Actinic Keratoses by method of PhotoDynamic Therapy (PDT) and/or prescription Topical Chemotherapy agents such as 5-fluorouracil,  5-fluorouracil/calcipotriene, and/or imiquimod.  The purpose is to decrease the number of clinically evident and subclinical PreCancerous lesions to prevent progression to development of skin cancer by chemically destroying early precancer changes that may or may not be visible.  It has been shown to reduce the risk of developing skin cancer in the treated area. As a result of treatment, redness, scaling, crusting, and open sores may occur during treatment course. One or more than one of these methods may be used and may have to be used several times to control, suppress and eliminate the PreCancerous changes. Discussed treatment course, expected reaction, and possible side effects. ?- Recommend daily broad spectrum sunscreen SPF 30+ to sun-exposed areas, reapply every 2 hours as needed.  ?- Staying in the shade or wearing long sleeves, sun glasses (UVA+UVB protection) and wide brim hats (4-inch brim around the entire circumference of the hat) are also recommended. ?- Call for new or changing lesions. ? ?Restart cream in 1 month.  ?- Start 5-fluorouracil/calcipotriene cream twice a day for 7 days to affected areas including temples, forehead and scalp. Prescription sent to [facility]. Patient advised they will receive an email to purchase the medication online and have it sent to their home. Patient provided with handout reviewing treatment course and side effects and advised to call or message [place] on MyChart with any concerns. ? ?Return in about 6 months (around 07/28/2022) for ak follow up. ?I, Ruthell Rummage, CMA, am acting as scribe for Sarina Ser, MD. ?Documentation: I have reviewed the above documentation for accuracy and completeness, and I agree with the above. ? ?Sarina Ser, MD ? ?

## 2022-01-26 NOTE — Patient Instructions (Addendum)
Restart in 1 month apply to scalp, forehead and temples twice daily for 7 days.  ? ?5-Fluorouracil/Calcipotriene Patient Education  ? ?Actinic keratoses are the dry, red scaly spots on the skin caused by sun damage. A portion of these spots can turn into skin cancer with time, and treating them can help prevent development of skin cancer.  ? ?Treatment of these spots requires removal of the defective skin cells. There are various ways to remove actinic keratoses, including freezing with liquid nitrogen, treatment with creams, or treatment with a blue light procedure in the office.  ? ?5-fluorouracil cream is a topical cream used to treat actinic keratoses. It works by interfering with the growth of abnormal fast-growing skin cells, such as actinic keratoses. These cells peel off and are replaced by healthy ones.  ? ?5-fluorouracil/calcipotriene is a combination of the 5-fluorouracil cream with a vitamin D analog cream called calcipotriene. The calcipotriene alone does not treat actinic keratoses. However, when it is combined with 5-fluorouracil, it helps the 5-fluorouracil treat the actinic keratoses much faster so that the same results can be achieved with a much shorter treatment time. ? ?INSTRUCTIONS FOR 5-FLUOROURACIL/CALCIPOTRIENE CREAM:  ? ?5-fluorouracil/calcipotriene cream typically only needs to be used for 4-7 days. A thin layer should be applied twice a day to the treatment areas recommended by your physician.  ? ?If your physician prescribed you separate tubes of 5-fluourouracil and calcipotriene, apply a thin layer of 5-fluorouracil followed by a thin layer of calcipotriene.  ? ?Avoid contact with your eyes, nostrils, and mouth. Do not use 5-fluorouracil/calcipotriene cream on infected or open wounds.  ? ?You will develop redness, irritation and some crusting at areas where you have pre-cancer damage/actinic keratoses. IF YOU DEVELOP PAIN, BLEEDING, OR SIGNIFICANT CRUSTING, STOP THE TREATMENT EARLY -  you have already gotten a good response and the actinic keratoses should clear up well. ? ?Wash your hands after applying 5-fluorouracil 5% cream on your skin.  ? ?A moisturizer or sunscreen with a minimum SPF 30 should be applied each morning.  ? ?Once you have finished the treatment, you can apply a thin layer of Vaseline twice a day to irritated areas to soothe and calm the areas more quickly. If you experience significant discomfort, contact your physician. ? ?For some patients it is necessary to repeat the treatment for best results. ? ?SIDE EFFECTS: When using 5-fluorouracil/calcipotriene cream, you may have mild irritation, such as redness, dryness, swelling, or a mild burning sensation. This usually resolves within 2 weeks. The more actinic keratoses you have, the more redness and inflammation you can expect during treatment. Eye irritation has been reported rarely. If this occurs, please let us know.  ?If you have any trouble using this cream, please call the office. If you have any other questions about this information, please do not hesitate to ask me before you leave the office. ? ? ? ? ? ? ? ? ?Actinic keratoses are precancerous spots that appear secondary to cumulative UV radiation exposure/sun exposure over time. They are chronic with expected duration over 1 year. A portion of actinic keratoses will progress to squamous cell carcinoma of the skin. It is not possible to reliably predict which spots will progress to skin cancer and so treatment is recommended to prevent development of skin cancer. ? ?Recommend daily broad spectrum sunscreen SPF 30+ to sun-exposed areas, reapply every 2 hours as needed.  ?Recommend staying in the shade or wearing long sleeves, sun glasses (UVA+UVB protection) and wide brim hats (  4-inch brim around the entire circumference of the hat). ?Call for new or changing lesions.  ? ?If You Need Anything After Your Visit ? ?If you have any questions or concerns for your doctor,  please call our main line at [phone] and press option 4 to reach your doctor's medical assistant. If no one answers, please leave a voicemail as directed and we will return your call as soon as possible. Messages left after 4 pm will be answered the following business day.  ? ?You may also send [place] a message via MyChart. We typically respond to MyChart messages within 1-2 business days. ? ?For prescription refills, please ask your pharmacy to contact our office. Our fax number is [phone]. ? ?If you have an urgent issue when the clinic is closed that cannot wait until the next business day, you can page your doctor at the number below.   ? ?Please note that while we do our best to be available for urgent issues outside of office hours, we are not available 24/7.  ? ?If you have an urgent issue and are unable to reach [place], you may choose to seek medical care at your doctor's office, retail clinic, urgent care center, or emergency room. ? ?If you have a medical emergency, please immediately call 911 or go to the emergency department. ? ?Pager Numbers ? ?- Dr. Nehemiah Massed: [phone] ? ?- Dr. Laurence Ferrari: [phone] ? ?- Dr. Nicole Kindred: [phone] ? ?In the event of inclement weather, please call our main line at [phone] for an update on the status of any delays or closures. ? ?Dermatology Medication Tips: ?Please keep the boxes that topical medications come in in order to help keep track of the instructions about where and how to use these. Pharmacies typically print the medication instructions only on the boxes and not directly on the medication tubes.  ? ?If your medication is too expensive, please contact our office at [phone] option 4 or send [place] a message through [place].  ? ?We are unable to tell what your co-pay for medications will be in advance as this is different depending on your insurance coverage. However, we may be able to find a substitute medication at lower cost or fill out paperwork to get  insurance to cover a needed medication.  ? ?If a prior authorization is required to get your medication covered by your insurance company, please allow [place] 1-2 business days to complete this process. ? ?Drug prices often vary depending on where the prescription is filled and some pharmacies may offer cheaper prices. ? ?The website www.goodrx.com contains coupons for medications through different pharmacies. The prices here do not account for what the cost may be with help from insurance (it may be cheaper with your insurance), but the website can give you the price if you did not use any insurance.  ?- You can print the associated coupon and take it with your prescription to the pharmacy.  ?- You may also stop by our office during regular business hours and pick up a GoodRx coupon card.  ?- If you need your prescription sent electronically to a different pharmacy, notify our office through [facility] or by phone at [phone] option 4. ? ? ? ? ?Si Usted Necesita Algo Despu?s de Su Visita ? ?Tambi?n puede enviarnos un mensaje a trav?s de MyChart. Por lo general respondemos a los mensajes de MyChart en el transcurso de 1 a 2 d?as h?biles. ? ?Para renovar recetas, por favor pida a su farmacia  que se ponga en contacto con nuestra oficina. Nuestro n?mero de fax es el [phone]. ? ?Si tiene un asunto urgente cuando la cl?nica est? cerrada y que no puede esperar hasta el siguiente d?a h?bil, puede llamar/localizar a su doctor(a) al n?mero que aparece a continuaci?n.  ? ?Por favor, tenga en cuenta que aunque hacemos todo lo posible para estar disponibles para asuntos urgentes fuera del horario de oficina, no estamos disponibles las 24 horas del d?a, los 7 d?as de la semana.  ? ?Si tiene un problema urgente y no puede comunicarse con nosotros, puede optar por buscar atenci?n m?dica  en el consultorio de su doctor(a), en una cl?nica privada, en un centro de atenci?n urgente o en una sala de emergencias. ? ?Si  tiene Engineer, maintenance (IT) m?dica, por favor llame inmediatamente al 911 o vaya a la sala de emergencias. ? ?N?meros de b?per ? ?- Dr. Nehemiah Massed: [phone] ? ?- Dra. Moye: [phone] ? ?- Dra. Nicole Kindred: [phone]

## 2022-02-03 ENCOUNTER — Encounter: Payer: Self-pay | Admitting: Dermatology

## 2022-07-31 ENCOUNTER — Ambulatory Visit: Payer: BC Managed Care – PPO | Admitting: Dermatology

## 2022-07-31 ENCOUNTER — Encounter: Payer: Self-pay | Admitting: Dermatology

## 2022-07-31 DIAGNOSIS — Z79899 Other long term (current) drug therapy: Secondary | ICD-10-CM

## 2022-07-31 DIAGNOSIS — L578 Other skin changes due to chronic exposure to nonionizing radiation: Secondary | ICD-10-CM

## 2022-07-31 DIAGNOSIS — Z5111 Encounter for antineoplastic chemotherapy: Secondary | ICD-10-CM | POA: Diagnosis not present

## 2022-07-31 DIAGNOSIS — L570 Actinic keratosis: Secondary | ICD-10-CM

## 2022-07-31 NOTE — Progress Notes (Signed)
Follow-Up Visit   Subjective  Joseph [name] is a 61 y.o. male who presents for the following: Actinic Keratosis (6 month recheck. Scalp, face, temples. Hx of LN2 Tx. Used 5FU/Calcipotriene since last visit on temples, forehead, and scalp). The patient has spots, moles and lesions to be evaluated, some may be new or changing and the patient has concerns that these could be cancer.  The following portions of the chart were reviewed this encounter and updated as appropriate:  Tobacco  Allergies  Meds  Problems  Med Hx  Surg Hx  Fam Hx     Review of Systems: No other skin or systemic complaints except as noted in HPI or Assessment and Plan.  Objective  Well appearing patient in no apparent distress; mood and affect are within normal limits.  A focused examination was performed including head, including the scalp, face, neck, nose, ears, eyelids, and lips. Relevant physical exam findings are noted in the Assessment and Plan.  Scalp Erythematous thin papules/macules with gritty scale.    Assessment & Plan   Actinic Damage - Severe, confluent actinic changes with pre-cancerous actinic keratoses  - Severe, chronic, not at goal, secondary to cumulative UV radiation exposure over time - diffuse scaly erythematous macules and papules with underlying dyspigmentation - Discussed Prescription "Field Treatment" for Severe, Chronic Confluent Actinic Changes with Pre-Cancerous Actinic Keratoses Field treatment involves treatment of an entire area of skin that has confluent Actinic Changes (Sun/ Ultraviolet light damage) and PreCancerous Actinic Keratoses by method of PhotoDynamic Therapy (PDT) and/or prescription Topical Chemotherapy agents such as 5-fluorouracil, 5-fluorouracil/calcipotriene, and/or imiquimod.  The purpose is to decrease the number of clinically evident and subclinical PreCancerous lesions to prevent progression to development of skin cancer by chemically destroying early  precancer changes that may or may not be visible.  It has been shown to reduce the risk of developing skin cancer in the treated area. As a result of treatment, redness, scaling, crusting, and open sores may occur during treatment course. One or more than one of these methods may be used and may have to be used several times to control, suppress and eliminate the PreCancerous changes. Discussed treatment course, expected reaction, and possible side effects. - Recommend daily broad spectrum sunscreen SPF 30+ to sun-exposed areas, reapply every 2 hours as needed.  - Staying in the shade or wearing long sleeves, sun glasses (UVA+UVB protection) and wide brim hats (4-inch brim around the entire circumference of the hat) are also recommended. - Call for new or changing lesions.  Start 5FU/Calcipotriene cream twice daily to scalp for 7 day   AK (actinic keratosis) Scalp  Actinic keratoses are precancerous spots that appear secondary to cumulative UV radiation exposure/sun exposure over time. They are chronic with expected duration over 1 year. A portion of actinic keratoses will progress to squamous cell carcinoma of the skin. It is not possible to reliably predict which spots will progress to skin cancer and so treatment is recommended to prevent development of skin cancer.  Recommend daily broad spectrum sunscreen SPF 30+ to sun-exposed areas, reapply every 2 hours as needed.  Recommend staying in the shade or wearing long sleeves, sun glasses (UVA+UVB protection) and wide brim hats (4-inch brim around the entire circumference of the hat). Call for new or changing lesions.  Start 5FU/Calcipotriene cream twice daily to scalp for 7 days   Return in about 6 months (around 01/30/2023) for AK Follow Up.  I, Emelia Salisbury, CMA, am acting as scribe  for Sarina Ser, MD. Documentation: I have reviewed the above documentation for accuracy and completeness, and I agree with the above.  Sarina Ser,  MD

## 2022-07-31 NOTE — Patient Instructions (Signed)
Start 5FU/Calcipotriene cream twice daily to scalp for 7 days   Instructions for [facility] Medications  One or more of your medications was sent to the [facility] mail order compounding pharmacy. You will receive an email from them and can purchase the medicine through that link. It will then be mailed to your home at the address you confirmed. If for any reason you do not receive an email from them, please check your spam folder. If you still do not find the email, please let us know. [facility] phone number is [phone].    Due to recent changes in healthcare laws, you may see results of your pathology and/or laboratory studies on MyChart before the doctors have had a chance to review them. We understand that in some cases there may be results that are confusing or concerning to you. Please understand that not all results are received at the same time and often the doctors may need to interpret multiple results in order to provide you with the best plan of care or course of treatment. Therefore, we ask that you please give [place] 2 business days to thoroughly review all your results before contacting the office for clarification. Should we see a critical lab result, you will be contacted sooner.   If You Need Anything After Your Visit  If you have any questions or concerns for your doctor, please call our main line at [phone] and press option 4 to reach your doctor's medical assistant. If no one answers, please leave a voicemail as directed and we will return your call as soon as possible. Messages left after 4 pm will be answered the following business day.   You may also send [place] a message via [place]. We typically respond to MyChart messages within 1-2 business days.  For prescription refills, please ask your pharmacy to contact our office. Our fax number is [phone].  If you have an urgent issue when the clinic is closed that cannot wait until the next business day,  you can page your doctor at the number below.    Please note that while we do our best to be available for urgent issues outside of office hours, we are not available 24/7.   If you have an urgent issue and are unable to reach [place], you may choose to seek medical care at your doctor's office, retail clinic, urgent care center, or emergency room.  If you have a medical emergency, please immediately call 911 or go to the emergency department.  Pager Numbers  - Dr. Nehemiah Massed: [phone]  - Dr. Laurence Ferrari: [phone]  - Dr. Nicole Kindred: [phone]  In the event of inclement weather, please call our main line at [phone] for an update on the status of any delays or closures.  Dermatology Medication Tips: Please keep the boxes that topical medications come in in order to help keep track of the instructions about where and how to use these. Pharmacies typically print the medication instructions only on the boxes and not directly on the medication tubes.   If your medication is too expensive, please contact our office at [phone] option 4 or send [place] a message through [place].   We are unable to tell what your co-pay for medications will be in advance as this is different depending on your insurance coverage. However, we may be able to find a substitute medication at lower cost or fill out paperwork to get insurance to cover a needed medication.   If a prior authorization is  required to get your medication covered by your insurance company, please allow [place] 1-2 business days to complete this process.  Drug prices often vary depending on where the prescription is filled and some pharmacies may offer cheaper prices.  The website www.goodrx.com contains coupons for medications through different pharmacies. The prices here do not account for what the cost may be with help from insurance (it may be cheaper with your insurance), but the website can give you the price if you did not use any insurance.   - You can print the associated coupon and take it with your prescription to the pharmacy.  - You may also stop by our office during regular business hours and pick up a GoodRx coupon card.  - If you need your prescription sent electronically to a different pharmacy, notify our office through [facility] or by phone at [phone] option 4.     Si Usted Necesita Algo Despus de Su Visita  Tambin puede enviarnos un mensaje a travs de Pharmacist, community. Por lo general respondemos a los mensajes de MyChart en el transcurso de 1 a 2 das hbiles.  Para renovar recetas, por favor pida a su farmacia que se ponga en contacto con nuestra oficina. Harland Dingwall de fax es [place] [phone].  Si tiene un asunto urgente cuando la clnica est cerrada y que no puede esperar hasta el siguiente da hbil, puede llamar/localizar a su doctor(a) al nmero que aparece a continuacin.   Por favor, tenga en cuenta que aunque hacemos todo lo posible para estar disponibles para asuntos urgentes fuera del horario de [place], no estamos disponibles las 24 horas del da, los 7 das de la [place].   Si tiene un problema urgente y no puede comunicarse con nosotros, puede optar por buscar atencin mdica  en el consultorio de su doctor(a), en una clnica privada, en un centro de atencin urgente o en una sala de emergencias.  Si tiene Engineering geologist, por favor llame inmediatamente al 911 o vaya a la sala de emergencias.  Nmeros de bper  - Dr. Nehemiah Massed: [phone]  - Dra. Moye: [phone]  - Dra. Nicole Kindred: [phone]  En caso de inclemencias del [place], por favor llame a Johnsie Kindred principal al [phone] para una actualizacin sobre el [place] de cualquier retraso o cierre.  Consejos para la medicacin en dermatologa: Por favor, guarde las cajas en las que vienen los medicamentos de uso tpico para ayudarle a seguir las instrucciones sobre dnde y cmo usarlos. Las farmacias generalmente  imprimen las instrucciones del medicamento slo en las cajas y no directamente en los tubos del [place].   Si su medicamento es muy caro, por favor, pngase en contacto con Zigmund Daniel llamando al [phone] y presione la opcin 4 o envenos un mensaje a travs de Pharmacist, community.   No podemos decirle cul ser su copago por los medicamentos por adelantado ya que esto es diferente dependiendo de la cobertura de su seguro. Sin embargo, es posible que podamos encontrar un medicamento sustituto a Electrical engineer un formulario para que el seguro cubra el medicamento que se considera necesario.   Si se requiere una autorizacin previa para que su compaa de seguros [place] su medicamento, por favor permtanos de 1 a 2 das hbiles para completar este proceso.  Los precios de los medicamentos varan con frecuencia dependiendo del Environmental consultant de dnde se surte la receta y alguna farmacias pueden ofrecer precios ms baratos.  El sitio web www.goodrx.com tiene cupones para medicamentos de  diferentes farmacias. Los precios aqu no tienen en cuenta lo que podra costar con la ayuda del seguro (puede ser ms barato con su seguro), pero el sitio web puede darle el precio si no utiliz Research scientist (physical sciences).  - Puede imprimir el cupn correspondiente y llevarlo con su receta a la farmacia.  - Tambin puede pasar por nuestra oficina durante el horario de atencin regular y Charity fundraiser una tarjeta de cupones de GoodRx.  - Si necesita que su receta se enve electrnicamente a una farmacia diferente, informe a nuestra oficina a travs de MyChart de [place] o por telfono llamando al [phone] y presione la opcin 4.

## 2022-08-12 ENCOUNTER — Encounter: Payer: Self-pay | Admitting: Dermatology

## 2022-08-27 IMAGING — MR MR LUMBAR SPINE W/O CM
5 series · 31 of 48 positions shown · non-contrast
Comparison: None.

CLINICAL DATA: Sudden onset low back pain radiating to right
buttock

EXAM:
MRI LUMBAR SPINE WITHOUT CONTRAST
TECHNIQUE: Multiplanar, multisequence MR imaging of the lumbar spine was
performed. No intravenous contrast was administered.

[Series 5: T2 · sagittal · 4.0mm · 0.81mm/px · 6 of 17 slices shown (1 of 2)]
[im 1/17]
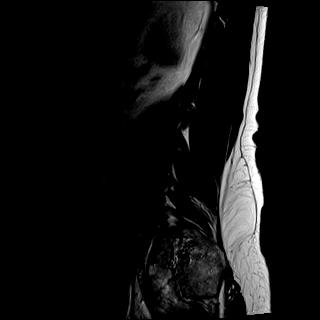
[im 4/17]
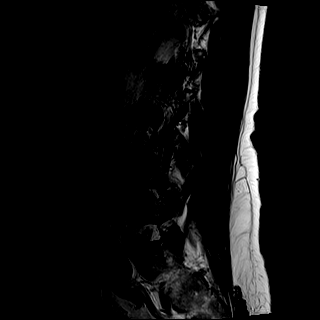
[im 7/17]
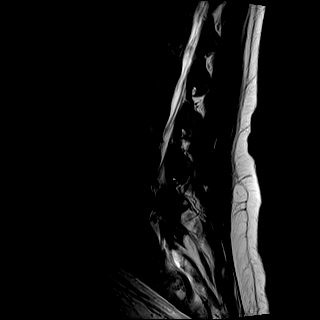
[im 10/17]
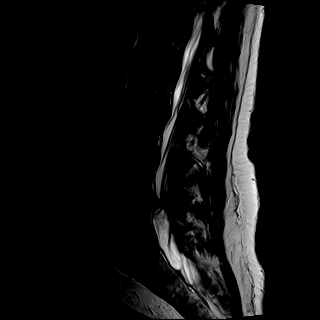
[im 13/17]
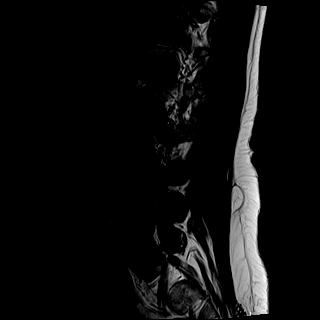
[im 17/17]
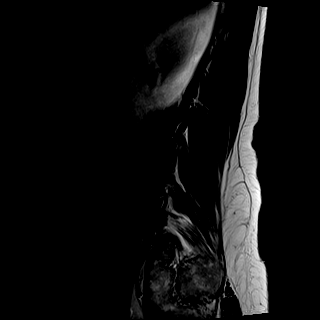

[Series 6: T1 · sagittal · 4.0mm · 0.81mm/px · 6 of 17 slices shown (1 of 2)]
[im 1/17]
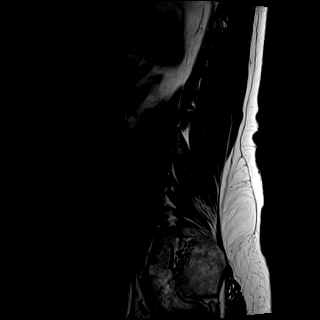
[im 4/17]
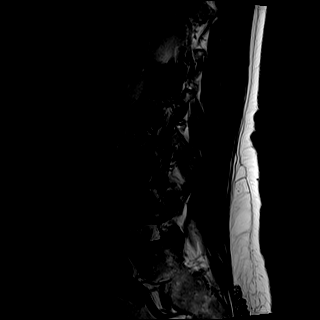
[im 7/17]
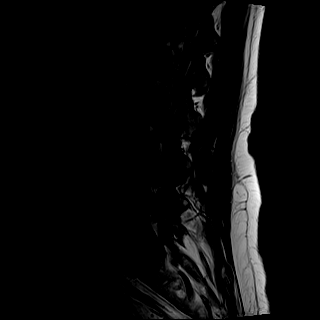
[im 10/17]
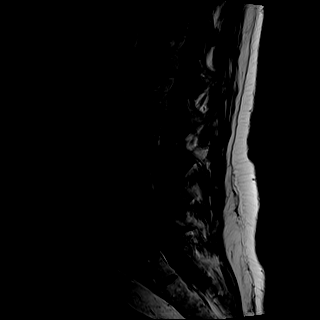
[im 13/17]
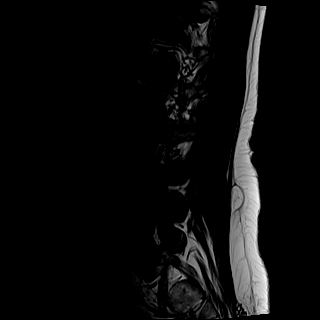
[im 17/17]
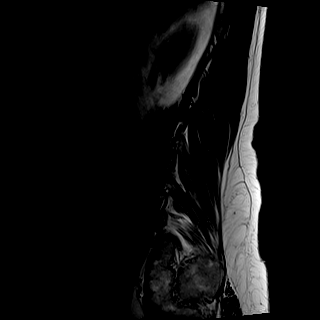

[Series 7: STIR · sagittal · 4.0mm · 0.41mm/px · 1 of 17 slices shown]
[im 1/17]
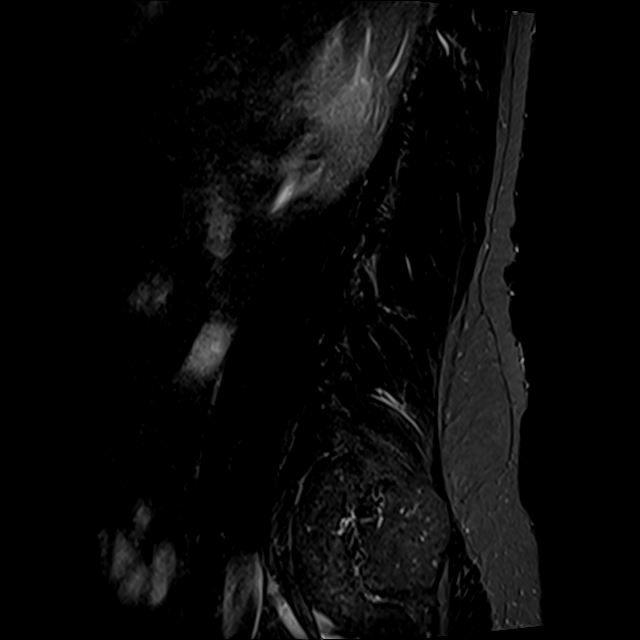

[Series 8: T2 · axial · 4.0mm · 0.78mm/px · z∈[-17,+206]mm · 9 of 39 slices shown (2 of 2)]
[im 1/39]
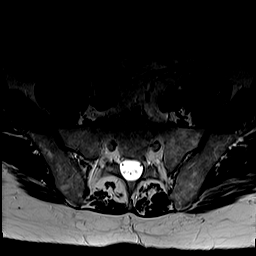
[im 6/39]
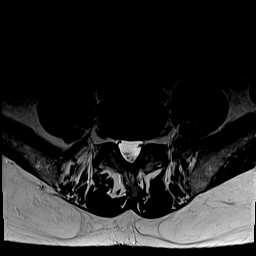
[im 11/39]
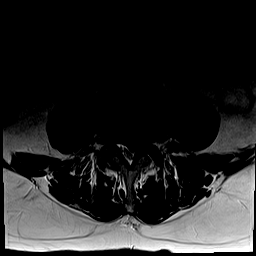
[im 17/39]
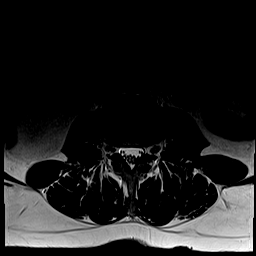
[im 20/39]
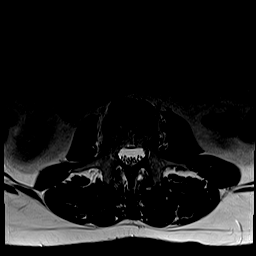
[im 22/39]
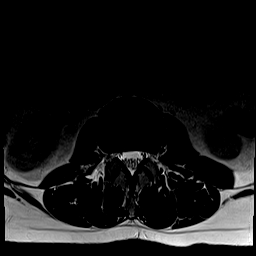
[im 28/39]
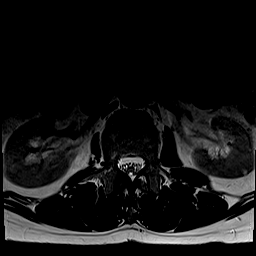
[im 33/39]
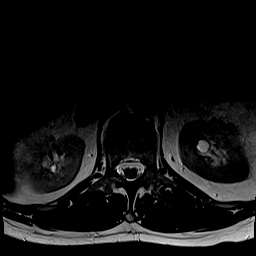
[im 39/39]
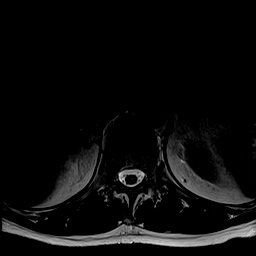

[Series 9: T1 · axial · 4.0mm · 0.39mm/px · z∈[-17,+206]mm · 9 of 39 slices shown (2 of 2)]
[im 1/39]
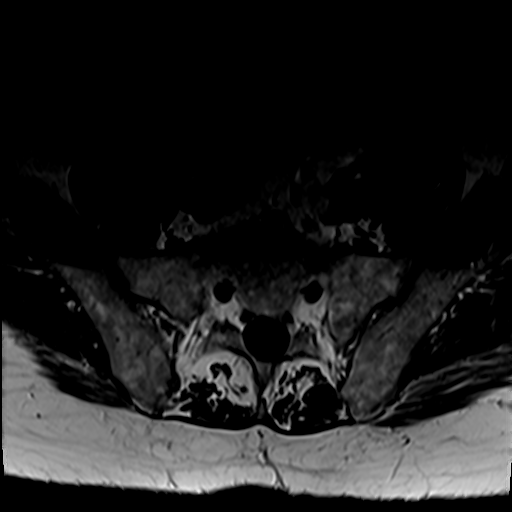
[im 6/39]
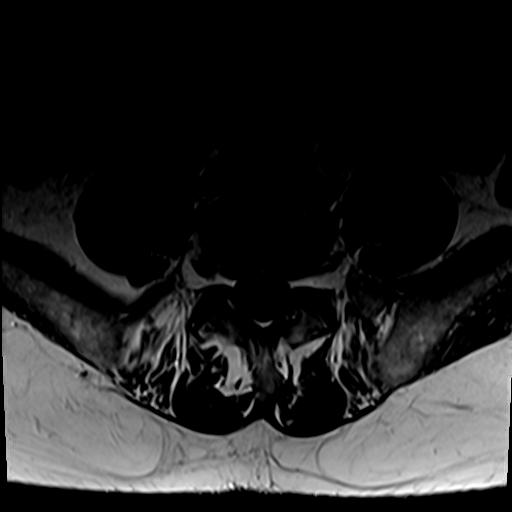
[im 11/39]
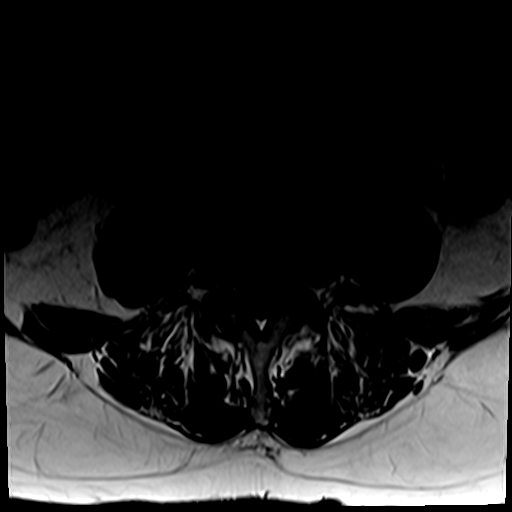
[im 17/39]
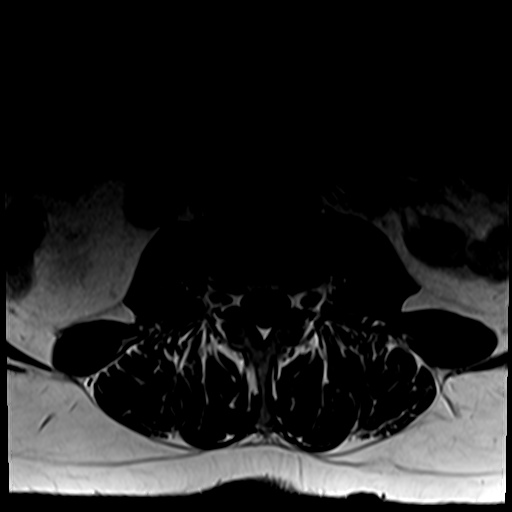
[im 20/39]
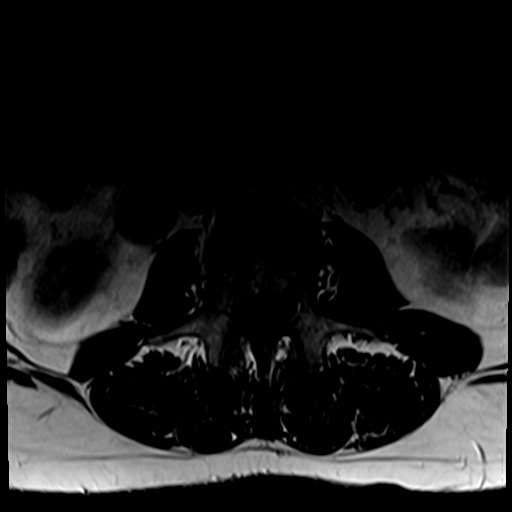
[im 22/39]
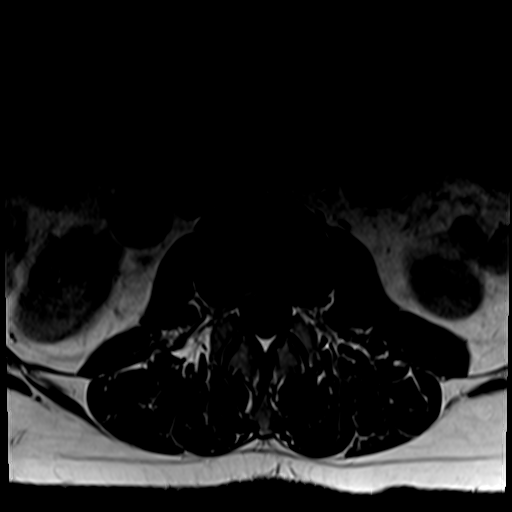
[im 28/39]
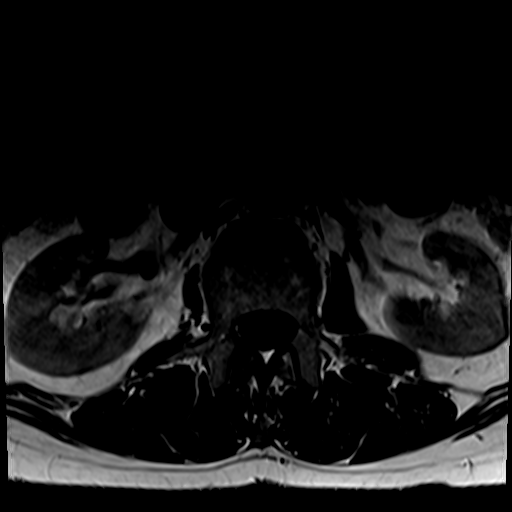
[im 33/39]
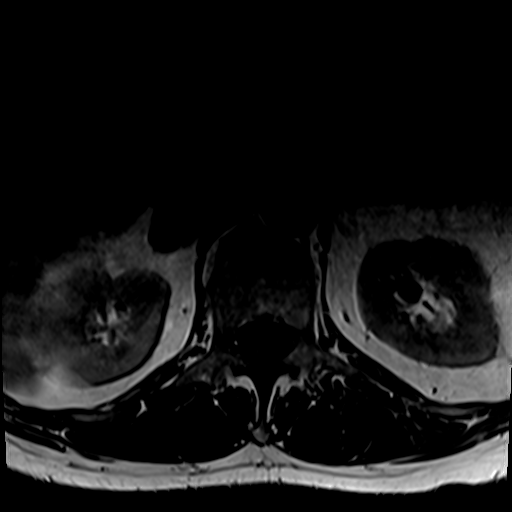
[im 39/39]
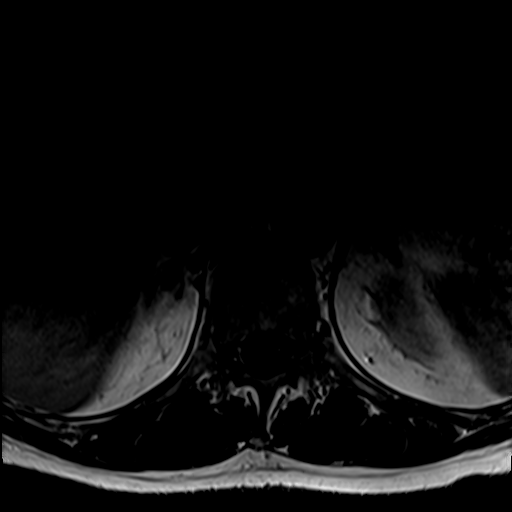

[31 of 48 positions shown; findings below may reference images not displayed]

FINDINGS: Segmentation:  Standard; the lowest disc space is designated L5-S1

Alignment:  Normal.

Vertebrae: Vertebral body heights are preserved. There is minimal
degenerative endplate marrow signal abnormality at L4-L5. Marrow
signal is otherwise somewhat heterogeneous throughout without focal
or suspicious marrow signal abnormality.

Conus medullaris and cauda equina: Conus extends to the mid L2
level. Conus and cauda equina appear normal.

Paraspinal and other soft tissues: The paraspinal soft tissues are
unremarkable. There are small probable parapelvic cysts in the left
kidney.

Disc levels:

There is disc desiccation and narrowing at L4-L5. There is mild
multilevel degenerative endplate change throughout the lumbar spine.

There is mild multilevel facet arthropathy, also most advanced at
L4-L5.

T12-L1: No significant spinal canal or neural foraminal stenosis.

L1-L2: There is a small central disc protrusion without significant
spinal canal or neural foraminal stenosis.

L2-L3: There is a mild broad-based right subarticular zone disc
protrusion without significant spinal canal or neural foraminal
stenosis.

L3-L4: There is a mild disc bulge and mild degenerative endplate
change and bilateral facet arthropathy resulting in mild right and
no significant left neural foraminal stenosis and no significant
spinal canal stenosis.

L4-L5: There is a diffuse disc bulge with a superimposed inferiorly
migrated left subarticular zone disc extrusion, degenerative
endplate change, and bilateral facet arthropathy resulting in severe
spinal canal stenosis with compression of the cauda equina nerve
roots particularly involving the traversing left-sided nerve roots
in the subarticular zone, and mild bilateral neural foraminal
stenosis.

L5-S1: No significant spinal canal or neural foraminal stenosis.
IMPRESSION: 1. Large disc extrusion at L4-L5 resulting in severe spinal canal
stenosis with compression of the cauda equina nerve roots with
particular impingement of the traversing left-sided nerve roots in
the subarticular zone.
2. Mild degenerative changes throughout the remainder of the lumbar
spine detailed above.

## 2023-01-16 ENCOUNTER — Encounter: Payer: Self-pay | Admitting: Podiatry

## 2023-01-17 ENCOUNTER — Other Ambulatory Visit: Payer: Self-pay | Admitting: Podiatry

## 2023-01-23 NOTE — Anesthesia Preprocedure Evaluation (Addendum)
Anesthesia Evaluation  Patient identified by MRN, date of birth, ID band Patient awake    Reviewed: Allergy & Precautions, H&P , NPO status , Patient's Chart, lab work & pertinent test results  Airway Mallampati: III  TM Distance: >3 FB Neck ROM: Full  Mouth opening: Limited Mouth Opening  Dental no notable dental hx.    Pulmonary neg pulmonary ROS   Pulmonary exam normal breath sounds clear to auscultation       Cardiovascular negative cardio ROS Normal cardiovascular exam Rhythm:Regular Rate:Normal     Neuro/Psych negative neurological ROS  negative psych ROS   GI/Hepatic Neg liver ROS,GERD  Controlled,,Rare mild GERD   Endo/Other  negative endocrine ROS    Renal/GU negative Renal ROS  negative genitourinary   Musculoskeletal   Abdominal   Peds negative pediatric ROS (+)  Hematology negative hematology ROS (+)   Anesthesia Other Findings Gout Actinic keratosis     Reproductive/Obstetrics negative OB ROS                             Anesthesia Physical Anesthesia Plan  ASA: 1  Anesthesia Plan:    Post-op Pain Management:    Induction: Intravenous  PONV Risk Score and Plan:   Airway Management Planned: LMA  Additional Equipment:   Intra-op Plan:   Post-operative Plan: Extubation in OR  Informed Consent: I have reviewed the patients History and Physical, chart, labs and discussed the procedure including the risks, benefits and alternatives for the proposed anesthesia with the patient or authorized representative who has indicated his/her understanding and acceptance.     Dental Advisory Given  Plan Discussed with: Anesthesiologist, CRNA and Surgeon  Anesthesia Plan Comments: (Patient consented for risks of anesthesia including but not limited to:  - adverse reactions to medications - damage to eyes, teeth, lips or other oral mucosa - nerve damage due to positioning   - sore throat or hoarseness - Damage to heart, brain, nerves, lungs, other parts of body or loss of life  Patient voiced understanding.)       Anesthesia Quick Evaluation

## 2023-01-24 NOTE — Discharge Instructions (Addendum)
[facility]  POST OPERATIVE INSTRUCTIONS FOR DR. Ether Griffins AND DR. Meeya Goldin [facility] CLINIC PODIATRY DEPARTMENT   Take your medication as prescribed.  Pain medication should be taken only as needed.  May take Tylenol between pain medication doses if needed.  If pain is severe then you may take 1 pain tablet every 4 hours if taking as prescribed is not working.  If still severe then try taking 2 pain tablets every 4 hours.  Try to only take the pain medication if the pain is severe if it is mild to moderate then just take Tylenol alone.  Maximum dose of Tylenol per day or acetaminophen is 4000 mg a day.  Keep the dressing clean, dry and intact.  Try to keep the boot on for the most part at all times.  If you are resting and not up and about then you can take the boot off.  Do not work on any range of motion to your toes at this time but may work on gentle range of motion of your ankle.  Keep your foot elevated above the heart level for the first 48 hours.  Continue elevation to improve swelling.  May also apply ice to the top of the right foot or ankle for maximum 10 minutes out of every 1 hour as needed  Walking to the bathroom and brief periods of walking are acceptable, unless we have instructed you to be non-weight bearing.  Always wear your post-op shoe when walking.  Always use your crutches if you are to be non-weight bearing.  Do not take a shower. Baths are permissible as long as the foot is kept out of the water.   Every hour you are awake:  Bend your knee 15 times. Flex foot 15 times but not toes Massage calf 15 times  Call [facility] [phone]) if any of the following problems occur: You develop a temperature or fever. The bandage becomes saturated with blood. Medication does not stop your pain. Injury of the foot occurs. Any symptoms of infection including redness, odor, or red streaks running from wound.

## 2023-01-31 ENCOUNTER — Ambulatory Visit: Payer: BC Managed Care – PPO | Admitting: Dermatology

## 2023-02-01 ENCOUNTER — Other Ambulatory Visit: Payer: Self-pay

## 2023-02-01 ENCOUNTER — Ambulatory Visit
Admission: RE | Admit: 2023-02-01 | Discharge: 2023-02-01 | Disposition: A | Discharge status: Home / Self Care | Payer: BC Managed Care – PPO | Attending: Podiatry | Admitting: Podiatry

## 2023-02-01 ENCOUNTER — Ambulatory Visit: Payer: Self-pay

## 2023-02-01 ENCOUNTER — Ambulatory Visit: Payer: BC Managed Care – PPO | Admitting: Anesthesiology

## 2023-02-01 ENCOUNTER — Encounter
Admission: RE | Disposition: A | Discharge status: Home / Self Care | Payer: Self-pay | Source: Home / Self Care | Attending: Podiatry

## 2023-02-01 DIAGNOSIS — M205X1 Other deformities of toe(s) (acquired), right foot: Secondary | ICD-10-CM | POA: Diagnosis not present

## 2023-02-01 DIAGNOSIS — M1A9XX1 Chronic gout, unspecified, with tophus (tophi): Secondary | ICD-10-CM | POA: Diagnosis not present

## 2023-02-01 DIAGNOSIS — M2041 Other hammer toe(s) (acquired), right foot: Secondary | ICD-10-CM | POA: Insufficient documentation

## 2023-02-01 HISTORY — DX: Other specified health status: Z78.9

## 2023-02-01 HISTORY — PX: HAMMER TOE SURGERY: SHX385

## 2023-02-01 SURGERY — CORRECTION, HAMMER TOE
Anesthesia: Choice | Site: Toe | Laterality: Right

## 2023-02-01 MED ORDER — 0.9 % SODIUM CHLORIDE (POUR BTL) OPTIME
TOPICAL | Status: DC | PRN
  Administered 2023-02-01: 500 mL

## 2023-02-01 MED ORDER — BACITRACIN 500 UNIT/GM EX OINT
TOPICAL_OINTMENT | CUTANEOUS | Status: DC | PRN
  Administered 2023-02-01: 1 via TOPICAL

## 2023-02-01 MED ORDER — AMOXICILLIN-POT CLAVULANATE 875-125 MG PO TABS: 1.0000 | ORAL_TABLET | Freq: Two times a day (BID) | ORAL | 0 refills | Status: DC

## 2023-02-01 MED ORDER — BUPIVACAINE HCL (PF) 0.5 % IJ SOLN
INTRAMUSCULAR | Status: DC | PRN
  Administered 2023-02-01: 20 mL

## 2023-02-01 MED ORDER — OXYCODONE-ACETAMINOPHEN 5-325 MG PO TABS: 1.0000 | ORAL_TABLET | Freq: Four times a day (QID) | ORAL | 0 refills | Status: AC | PRN

## 2023-02-01 MED ORDER — DEXAMETHASONE SODIUM PHOSPHATE 4 MG/ML IJ SOLN
INTRAMUSCULAR | Status: DC | PRN
  Administered 2023-02-01: 4 mg via INTRAVENOUS

## 2023-02-01 MED ORDER — LACTATED RINGERS IV SOLN: INTRAVENOUS | Status: DC

## 2023-02-01 MED ORDER — CEFAZOLIN SODIUM-DEXTROSE 2-4 GM/100ML-% IV SOLN
2.0000 g | INTRAVENOUS | Status: AC
  Administered 2023-02-01: 2 g via INTRAVENOUS

## 2023-02-01 MED ORDER — PROPOFOL 10 MG/ML IV BOLUS
INTRAVENOUS | Status: DC | PRN
  Administered 2023-02-01: 150 mg via INTRAVENOUS

## 2023-02-01 MED ORDER — MIDAZOLAM HCL 5 MG/5ML IJ SOLN
INTRAMUSCULAR | Status: DC | PRN
  Administered 2023-02-01: 2 mg via INTRAVENOUS

## 2023-02-01 MED ORDER — ONDANSETRON HCL 4 MG/2ML IJ SOLN
INTRAMUSCULAR | Status: DC | PRN
  Administered 2023-02-01: 4 mg via INTRAVENOUS

## 2023-02-01 MED ORDER — ONDANSETRON HCL 4 MG PO TABS: 4.0000 mg | ORAL_TABLET | Freq: Three times a day (TID) | ORAL | 0 refills | Status: AC | PRN

## 2023-02-01 MED ORDER — FENTANYL CITRATE (PF) 100 MCG/2ML IJ SOLN
INTRAMUSCULAR | Status: DC | PRN
  Administered 2023-02-01: 100 ug via INTRAVENOUS

## 2023-02-01 MED ORDER — LIDOCAINE HCL (CARDIAC) PF 100 MG/5ML IV SOSY
PREFILLED_SYRINGE | INTRAVENOUS | Status: DC | PRN
  Administered 2023-02-01: 100 mg via INTRATRACHEAL

## 2023-02-01 SURGICAL SUPPLY — 39 items
BLADE OSC/SAGITTAL MD 5.5X18 (BLADE) IMPLANT
BLADE SURG 15 STRL LF DISP TIS (BLADE) IMPLANT
BLADE SURG 15 STRL SS (BLADE) ×1
BNDG CMPR 5X4 CHSV STRCH STRL (GAUZE/BANDAGES/DRESSINGS) ×1
BNDG CMPR 75X41 PLY HI ABS (GAUZE/BANDAGES/DRESSINGS) ×1
BNDG COHESIVE 4X5 TAN STRL LF (GAUZE/BANDAGES/DRESSINGS) ×1 IMPLANT
BNDG ESMARK 4X12 TAN STRL LF (GAUZE/BANDAGES/DRESSINGS) ×1 IMPLANT
BNDG GAUZE DERMACEA FLUFF 4 (GAUZE/BANDAGES/DRESSINGS) ×1 IMPLANT
BNDG GZE DERMACEA 4 6PLY (GAUZE/BANDAGES/DRESSINGS) ×1
BNDG STRETCH 4X75 STRL LF (GAUZE/BANDAGES/DRESSINGS) ×1 IMPLANT
BOOT STEPPER DURA LG (SOFTGOODS) IMPLANT
CANISTER SUCT 1200ML W/VALVE (MISCELLANEOUS) ×1 IMPLANT
CLIP FIXATION STAPLE 10X10X10 (Staple) IMPLANT
COVER LIGHT HANDLE UNIVERSAL (MISCELLANEOUS) ×2 IMPLANT
CUFF TOURN SGL QUICK 18X4 (TOURNIQUET CUFF) IMPLANT
DRAPE FLUOR MINI C-ARM 54X84 (DRAPES) ×1 IMPLANT
DURAPREP 26ML APPLICATOR (WOUND CARE) ×1 IMPLANT
ELECT REM PT RETURN 9FT ADLT (ELECTROSURGICAL) ×1
ELECTRODE REM PT RTRN 9FT ADLT (ELECTROSURGICAL) ×1 IMPLANT
GAUZE SPONGE 4X4 12PLY STRL (GAUZE/BANDAGES/DRESSINGS) ×1 IMPLANT
GAUZE XEROFORM 1X8 LF (GAUZE/BANDAGES/DRESSINGS) ×1 IMPLANT
GLOVE BIOGEL PI IND STRL 7.5 (GLOVE) ×1 IMPLANT
GLOVE SURG SS PI 7.0 STRL IVOR (GLOVE) ×1 IMPLANT
GOWN STRL REUS W/ TWL LRG LVL3 (GOWN DISPOSABLE) ×2 IMPLANT
GOWN STRL REUS W/TWL LRG LVL3 (GOWN DISPOSABLE) ×2
K-WIRE DBL END TROCAR 6X.045 (WIRE) ×1
KIT PROCEDURE DRILL (DRILL) IMPLANT
KIT TURNOVER KIT A (KITS) ×1 IMPLANT
KWIRE DBL END TROCAR 6X.045 (WIRE) IMPLANT
NS IRRIG 500ML POUR BTL (IV SOLUTION) ×1 IMPLANT
PACK EXTREMITY ARMC (MISCELLANEOUS) ×1 IMPLANT
PIN BALLS 3/8 F/.045 WIRE (MISCELLANEOUS) ×1 IMPLANT
STOCKINETTE IMPERVIOUS LG (DRAPES) ×1 IMPLANT
SUT ETHILON 3-0 (SUTURE) IMPLANT
SUT ETHILON 4 0 FS (SUTURE) IMPLANT
SUT MNCRL 5-0+ PC-1 (SUTURE) IMPLANT
SUT MONOCRYL 5-0 (SUTURE)
SUT VIC AB 3-0 SH 27 (SUTURE) ×1
SUT VIC AB 3-0 SH 27X BRD (SUTURE) IMPLANT

## 2023-02-01 NOTE — Op Note (Signed)
PODIATRY / FOOT AND ANKLE SURGERY OPERATIVE REPORT    SURGEON: Rosetta Posner, DPM  PRE-OPERATIVE DIAGNOSIS:  1.  Right foot hallux malposition, increased hallux interphalangeus angle 2.  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  Gouty arthritis present to the DIPJ of the right second toe  PATHOLOGY/SPECIMEN(S): None  INDICATIONS:   Joseph [name] is a 62 y.o. male who presents with gouty arthritic pain to the DIPJ of the right second toe.  Patient has been treated with gout medications but still continues to have pain and discomfort.  Further conservative measures were discussed with patient in detail but he continued to have pain and discomfort despite this.  All treatment options were discussed with the patient both conservative and surgical attempts at correction include potential risks and patient is at this time patient is elected for right second toe DIPJ fusion with Akin osteotomy first toe.  No guarantees given.  Consent obtained prior to procedure.  DESCRIPTION: After obtaining full informed written consent, the patient was brought back to the operating room and placed supine upon the operating table.  The patient received IV antibiotics prior to induction.  After obtaining adequate anesthesia, 20 cc of half percent Marcaine plain was injected about the right first and second rays.  The patient was prepped and draped in the standard fashion.  An Esmarch bandage was used to exsanguinate the right lower extremity and the pneumatic ankle tourniquet was inflated.  Attention was then directed to the hallux proximal phalanx where incision was made medial to the tendon of the extensor hallucis longus.  The incision was deepened to the subcutaneous  tissues utilizing sharp and blunt dissection care was taken to identify and retract all vital neural and vascular structures and all venous contributories were cauterized as necessary.  At this time a periosteal incision was made medial to the tendon of the extensor hallucis longus.  The periosteum was reflected medially and laterally thereby exposing the periosteum of the proximal phalanx at the midshaft of the hallux.  At this time Hohmann retractors were then placed around the proximal phalanx at the midshaft portion and a guidewire was placed from dorsal to plantar at the lateral shaft of the big toe proximal phalanx.  This was checked under fluoroscopic guidance and appeared to be in the appropriate position with the appropriate orientation.  At this time a 3 mm closing base wedge was resected and passed off the operative site.  The guidewire was removed and the osteotomy was then closed moving the toe in a slight medial position.  The opposition appeared to be excellent overall and this appeared to correct the hallux interphalangeus angle clinically and radiographically.  Once the position was obtained a 10 mm Paragon 28 staple was then placed across the medial aspect of the proximal phalanx utilizing standard manufactures protocol and standard AO principles and techniques, excellent compression was noted across the osteotomy site.  Attention was then directed to the right second toe at the DIPJ where a elliptical incision was then made over the joint.  The skin ellipse was resected and passed off the operative site.  An extensor tenotomy and capsulotomy was performed followed by release the collateral ligaments.  At this time the DIPJ was able to be identified.  It  appeared to have severe cartilage damage to the area with gouty arthritic changes present and gout crystals present within the joint.  A sagittal bone saw was then used to resect the head of the middle phalanx with the appropriate orientation  removing more medially than laterally due to the lateral deviation of the toe at the DIPJ.  A rongeur and curette was then used to remove any articular cartilage that was still present at the distal phalanx base.  A rongeur and curette was also used to remove any further bony debris at the level of the joint.  The surgical site was flushed with copious amounts normal sterile saline.  At this time a 0.045 K wire was then drilled through the distal tip of the second toe across the DIPJ and into the middle phalanx while holding the toe in a rectus position.  This was checked under fluoroscopic guidance and appeared to sit in a rectus position both clinically and radiographically.  The pin was then bent and cut and a pin cap was placed.  Both surgical sites were flushed with copious amounts normal sterile saline.  Final C-arm imaging was then taken showing rectus position of first and second toes with the appropriate oriented hardware.  The extensor tendon was then reapproximated well coapted at the second toe with 3-0 Vicryl.  The periosteum and subcutaneous tissue at the big toe was then reapproximated well coapted with 3-0 Vicryl.  The skin was then reapproximated well coapted with combination of 3-0 and 4-0 nylon with simple horizontal mattress type stitching.  Prior to closing the incision the pneumatic ankle tourniquet was deflated and a prompt hyperemic response was noted to all digits of right foot.  A postoperative dressing was then applied consisting of Xeroform to the incision lines and bacitracin antibiotic to the pin site, 4 x 4 gauze, Kling, Kerlix, Ace wrap, tall cam boot.  The patient tolerated the procedure and anesthesia well was transferred to the recovery room vital signs stable vascular status intact all toes the right foot.  Following period of postoperative monitoring the patient be discharged home with the appropriate orders, instructions, and medications.  Patient may remain partial  weightbearing with heel contact in boot.  COMPLICATIONS: None  CONDITION: Good, stable  Rosetta Posner, DPM

## 2023-02-01 NOTE — Transfer of Care (Signed)
Immediate Anesthesia Transfer of Care Note  Patient: Joseph [name]  Procedure(s) Performed: HAMMER TOE CORRECTION SECOND DISTAL INTERPHALANGEAL JOINT FUSION (Right: Toe)  Patient Location: PACU  Anesthesia Type: No value filed.  Level of Consciousness: awake, alert  and patient cooperative  Airway and Oxygen Therapy: Patient Spontanous Breathing and Patient connected to supplemental oxygen  Post-op Assessment: Post-op Vital signs reviewed, Patient's Cardiovascular Status Stable, Respiratory Function Stable, Patent Airway and No signs of Nausea or vomiting  Post-op Vital Signs: Reviewed and stable  Complications: No notable events documented.

## 2023-02-01 NOTE — Anesthesia Postprocedure Evaluation (Signed)
Anesthesia Post Note  Patient: Joseph [name]  Procedure(s) Performed: HAMMER TOE CORRECTION SECOND DISTAL INTERPHALANGEAL JOINT FUSION (Right: Toe)  Patient location during evaluation: PACU Anesthesia Type: General Level of consciousness: awake and alert Pain management: pain level controlled Vital Signs Assessment: post-procedure vital signs reviewed and stable Respiratory status: spontaneous breathing, nonlabored ventilation, respiratory function stable and patient connected to nasal cannula oxygen Cardiovascular status: blood pressure returned to baseline and stable Postop Assessment: no apparent nausea or vomiting Anesthetic complications: no   No notable events documented.   Last Vitals:  Vitals:   02/01/23 0920 02/01/23 0925  BP:    Pulse: (!) 50 (!) 50  Resp: 10 14  Temp:  36.8 C  SpO2: 97% 97%    Last Pain:  Vitals:   02/01/23 0925  TempSrc:   PainSc: 0-No pain                 Marisue Humble

## 2023-02-01 NOTE — H&P (Signed)
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  The various methods of treatment have been discussed with the patient.  No guarantees were given.  After consideration of risks, benefits and other options for treatment, the patient has consented to surgery.  I have reviewed the patients' chart and labs.    PROCEDURE: RIGHT DIPJ 2ND TOE ARTHRODESIS RIGHT AKIN OSTEOTOMY   A history and physical examination was performed in my office.  The patient was reexamined.  There have been no changes to this history and physical examination.  Rosetta Posner, DPM

## 2023-02-02 ENCOUNTER — Encounter: Payer: Self-pay | Admitting: Podiatry

## 2023-03-14 ENCOUNTER — Ambulatory Visit: Payer: BC Managed Care – PPO | Admitting: Dermatology

## 2023-04-04 ENCOUNTER — Other Ambulatory Visit: Payer: Self-pay | Admitting: Neurology

## 2023-04-04 DIAGNOSIS — R413 Other amnesia: Secondary | ICD-10-CM

## 2023-04-04 DIAGNOSIS — R519 Headache, unspecified: Secondary | ICD-10-CM

## 2023-04-23 ENCOUNTER — Encounter: Payer: Self-pay | Admitting: Neurology

## 2023-04-24 ENCOUNTER — Ambulatory Visit
Admission: RE | Admit: 2023-04-24 | Discharge: 2023-04-24 | Disposition: A | Discharge status: Home / Self Care | Payer: BC Managed Care – PPO | Source: Ambulatory Visit | Attending: Neurology | Admitting: Neurology

## 2023-04-24 DIAGNOSIS — R413 Other amnesia: Secondary | ICD-10-CM

## 2023-04-24 DIAGNOSIS — R519 Headache, unspecified: Secondary | ICD-10-CM

## 2023-05-10 ENCOUNTER — Ambulatory Visit: Payer: BC Managed Care – PPO | Admitting: Urology

## 2023-05-10 ENCOUNTER — Encounter: Payer: Self-pay | Admitting: Urology

## 2023-05-10 VITALS — Ht 70.0 in | Wt 157.0 lb

## 2023-05-10 DIAGNOSIS — N486 Induration penis plastica: Secondary | ICD-10-CM

## 2023-05-10 DIAGNOSIS — Z125 Encounter for screening for malignant neoplasm of prostate: Secondary | ICD-10-CM | POA: Diagnosis not present

## 2023-05-10 DIAGNOSIS — N4889 Other specified disorders of penis: Secondary | ICD-10-CM | POA: Diagnosis not present

## 2023-05-10 NOTE — Progress Notes (Signed)
   05/10/23 2:21 PM   Joseph [name] [birth year] [id number]  CC: Penile pain, penile curvature, PSA screening  HPI: 62 year old male who reports at least a few weeks of penile pain with erections and upwards curvature of 90 degrees with erections that prevents sexual activity.  He is not having any problems with erections.  He still has some discomfort in pressure/pain when he gets erections, but seems to be somewhat improved.  Denies any urinary symptoms, no gross hematuria.  He is also had some issues with weight loss of unclear etiology.  PSA normal at 0.92.   PMH: Past Medical History:  Diagnosis Date   Actinic keratosis    Gout    Medical history non-contributory     Surgical History: Past Surgical History:  Procedure Laterality Date   COLONOSCOPY     GANGLION CYST EXCISION Right 09/28/2016   Procedure: REMOVAL GANGLION OF WRIST;  Surgeon: Erin Sons, MD;  Location: [facility];  Service: Orthopedics;  Laterality: Right;   HAMMER TOE SURGERY Right 02/01/2023   Procedure: HAMMER TOE CORRECTION SECOND DISTAL INTERPHALANGEAL JOINT FUSION;  Surgeon: Rosetta Posner, DPM;  Location: [facility];  Service: Podiatry;  Laterality: Right;   TONSILLECTOMY      Family History: No family history on file.  Social History:  reports that he has never smoked. He has never been exposed to tobacco smoke. He has never used smokeless tobacco. He reports current alcohol use. He reports that he does not use drugs.  Physical Exam: Ht 5\' 10"  (1.778 m)   Wt 157 lb (71.2 kg)   BMI 22.53 kg/m    Constitutional:  Alert and oriented, No acute distress. Cardiovascular: No clubbing, cyanosis, or edema. Respiratory: Normal respiratory effort, no increased work of breathing. GI: Abdomen is soft, nontender, nondistended, no abdominal masses GU: Circumcised phallus with patent meatus, easily palpable 2 cm penile plaque at the dorsal aspect of the mid phallus  Assessment & Plan:   62 year old  male with at least a few weeks of penile discomfort/pain with erections and dorsal curvature up to 90 degrees consistent with peyronies disease.  We reviewed the AUA guidelines, as well as the differences between the active and stable phase of the disease.  I recommended NSAIDs for pain at this time, with close follow-up in 3 months to check symptoms.  We discussed possible treatment options when she reaches the stable phase of the disease including Xiaflex or surgical correction like penile plication or grafting.  Reassurance provided regarding normal PSA value.  RTC 3 months symptom check   Legrand Rams, MD 05/10/2023  [facility] [address], [address] [place], [place] [address] [phone]

## 2023-05-10 NOTE — Patient Instructions (Signed)
What is Peyronie's disease?  Peyronie's (pay-roe-NEEZ) disease is a condition in which scar tissue (plaque) in your penis causes it to bend, curve or lose length or girth (circumference). You may be able to feel the scar tissue through your skin, or you may have pain in a specific part of your penis as the scar tissue forms. When you have an erection, your penis may bend up, down or to the side, depending on the location of your scar. Some people who have Peyronie's disease don't have a curve, but might have an indentation that gives their penis an "hourglass" appearance.  What are the stages of Peyronie's disease? Peyronie's disease has two stages: acute and chronic.  Acute Peyronie's disease. The acute stage lasts between six and 12 months. During this period, a scar forms under the skin of your penis, causing it to curve or change its shape another way. It may be painful when your penis is erect or soft (flaccid).  Chronic Peyronie's disease(stable phase). The scar is no longer growing during the chronic stage. The curvature won't get any worse. Pain usually goes away during the chronic phase, but it can sometimes continue, especially when you have an erection. Erectile dysfunction (ED) may develop during this phase.  How common is Peyronie's disease? Medical experts estimate that about 6% to 10% of people between the ages of 57 and 59 with penises have Peyronie's disease. It can affect anyone with a penis, but it's less common at other ages.  Peyronie's disease may be even more widespread because many people may feel too embarrassed to talk about it with a healthcare provider. It's a good idea to talk to a healthcare provider any time you have concerns about your sexual health.  Visit [email or web address] for more information about Peyronies disease and treatment options

## 2023-05-23 ENCOUNTER — Ambulatory Visit: Payer: BC Managed Care – PPO | Admitting: Dermatology

## 2023-08-16 ENCOUNTER — Ambulatory Visit: Payer: BC Managed Care – PPO | Admitting: Urology

## 2023-08-16 ENCOUNTER — Encounter: Payer: Self-pay | Admitting: Urology

## 2023-08-16 VITALS — BP 144/79 | HR 80 | Ht 70.0 in | Wt 169.0 lb

## 2023-08-16 DIAGNOSIS — N486 Induration penis plastica: Secondary | ICD-10-CM | POA: Diagnosis not present

## 2023-08-16 DIAGNOSIS — Z125 Encounter for screening for malignant neoplasm of prostate: Secondary | ICD-10-CM

## 2023-08-16 NOTE — Patient Instructions (Addendum)
You have peyronies disease.  This is a noncancerous scar tissue of the penis that causes curvature.  Treatment options include observation alone, Xiaflex injections to help reduce the curvature, or surgical treatments to straighten the penis.  Visit [email or web address] and click on peyronies disease for more information about peyronies as well as the injection treatment with Xiaflex  If you are interested in more definitive surgical treatments, please reach out to Dr. Lafonda Mosses who is a urologist with [facility] in [place]

## 2023-08-16 NOTE — Progress Notes (Signed)
   08/16/2023 11:05 AM   Joseph [name] 25-Dec-1960 [id number]  Reason for visit: Follow up peyronies disease, PSA screening  HPI: 62 year old male who I originally met in July 2024 for penile pain and curvature.  He was having an upward curvature of 90 degrees with erections that was painful and prevented sexual activity.  He reports his pain with erections has improved, and curvature seems to have improved slightly to closer to 75 degrees.  Curvature still prevents sexual activity.  He has some pressure with erections but denies any pain.  He also reports an hourglass deformity.  He denies any problems with ED.  PSA was normal at 0.92 from February 2024.  On exam, circumcised phallus with patent meatus, easily palpable dorsal plaque measuring approximately 1 cm in width and running from the base of the penis to the mid phallus.  We again reviewed the diagnosis of peyronies disease and treatment options including observation, Xiaflex injections, or more definitive surgical procedures like penile plication, excision and grafting, or penile prosthesis.    He is unsure how he would like to proceed at this time and will reach out to [place].  Okay to schedule Xiaflex injections if he desires, or place referral to Dr. Lafonda Mosses in [place] to consider surgical treatments  Sondra Come, MD  [facility] [address] [place], [place] [address] [phone]

## 2023-12-24 ENCOUNTER — Other Ambulatory Visit: Payer: Self-pay | Admitting: Family Medicine

## 2023-12-24 DIAGNOSIS — R221 Localized swelling, mass and lump, neck: Secondary | ICD-10-CM

## 2023-12-26 ENCOUNTER — Other Ambulatory Visit: Payer: Self-pay | Admitting: Family Medicine

## 2023-12-26 DIAGNOSIS — R221 Localized swelling, mass and lump, neck: Secondary | ICD-10-CM

## 2023-12-27 ENCOUNTER — Ambulatory Visit
Admission: RE | Admit: 2023-12-27 | Discharge: 2023-12-27 | Disposition: A | Discharge status: Home / Self Care | Source: Ambulatory Visit | Attending: Family Medicine | Admitting: Family Medicine

## 2023-12-27 DIAGNOSIS — R221 Localized swelling, mass and lump, neck: Secondary | ICD-10-CM

## 2023-12-27 MED ORDER — IOPAMIDOL (ISOVUE-370) INJECTION 76%
75.0000 mL | Freq: Once | INTRAVENOUS | Status: AC | PRN
  Administered 2023-12-27: 75 mL via INTRAVENOUS

## 2024-03-18 ENCOUNTER — Other Ambulatory Visit: Payer: Self-pay | Admitting: Endocrinology

## 2024-03-18 ENCOUNTER — Encounter: Payer: Self-pay | Admitting: Endocrinology

## 2024-03-18 DIAGNOSIS — E041 Nontoxic single thyroid nodule: Secondary | ICD-10-CM

## 2024-03-18 NOTE — Progress Notes (Signed)
 Patient for US  guided FNA LT thyroid nodule biopsy on Wed 03/19/24, I called and spoke with the patient's daughter, Ranell Butts on the phone and gave pre-procedure instructions. Melony was made aware to have the patient here at 2p and check in at the [facility]. Melony stated understanding.  Called 03/18/24

## 2024-03-19 ENCOUNTER — Ambulatory Visit
Admission: RE | Admit: 2024-03-19 | Discharge: 2024-03-19 | Disposition: A | Discharge status: Home / Self Care | Source: Ambulatory Visit | Attending: Endocrinology | Admitting: Endocrinology

## 2024-03-19 VITALS — BP 176/88 | HR 54

## 2024-03-19 DIAGNOSIS — E041 Nontoxic single thyroid nodule: Secondary | ICD-10-CM | POA: Diagnosis present

## 2024-03-19 MED ORDER — LIDOCAINE HCL (PF) 1 % IJ SOLN
3.0000 mL | Freq: Once | INTRAMUSCULAR | Status: AC
  Administered 2024-03-19: 3 mL via INTRADERMAL
  Filled 2024-03-19: qty 4

## 2024-03-19 NOTE — Discharge Instructions (Signed)
 Reviewed w/ pt

## 2024-03-19 NOTE — Procedures (Signed)
 PROCEDURE SUMMARY:  Using direct ultrasound guidance, 5 passes were made using 25 g needles into the nodule within the left lobe of the thyroid for standard labs. An additional 3 passes were made to send for flow cytometry.  Ultrasound was used to confirm needle placements on all occasions.   EBL = trace  Specimens were sent to Pathology for analysis.  See procedure note under Imaging tab in Epic for full procedure details.  Electronically Signed: Tayleigh Wetherell M Proctor Carriker, PA-C 03/19/2024, 3:28 PM

## 2024-03-21 LAB — CYTOLOGY - NON PAP

## 2024-04-02 LAB — SURGICAL PATHOLOGY
